# Patient Record
Sex: Male | Born: 1950 | Race: White | Hispanic: No | Marital: Married | Smoking: Former smoker
Health system: Southern US, Community
[De-identification: ages and names within clinical notes are randomized; demographics above are authoritative.]

## PROBLEM LIST (undated history)

## (undated) DIAGNOSIS — R3915 Urgency of urination: Secondary | ICD-10-CM

## (undated) DIAGNOSIS — I10 Essential (primary) hypertension: Secondary | ICD-10-CM

## (undated) DIAGNOSIS — E785 Hyperlipidemia, unspecified: Secondary | ICD-10-CM

## (undated) DIAGNOSIS — H40009 Preglaucoma, unspecified, unspecified eye: Secondary | ICD-10-CM

## (undated) DIAGNOSIS — K219 Gastro-esophageal reflux disease without esophagitis: Secondary | ICD-10-CM

## (undated) DIAGNOSIS — N400 Enlarged prostate without lower urinary tract symptoms: Secondary | ICD-10-CM

## (undated) DIAGNOSIS — Z973 Presence of spectacles and contact lenses: Secondary | ICD-10-CM

## (undated) DIAGNOSIS — Z87442 Personal history of urinary calculi: Secondary | ICD-10-CM

## (undated) DIAGNOSIS — N200 Calculus of kidney: Secondary | ICD-10-CM

## (undated) DIAGNOSIS — J449 Chronic obstructive pulmonary disease, unspecified: Secondary | ICD-10-CM

## (undated) HISTORY — DX: Benign prostatic hyperplasia without lower urinary tract symptoms: N40.0

## (undated) HISTORY — DX: Chronic obstructive pulmonary disease, unspecified: J44.9

## (undated) HISTORY — DX: Hyperlipidemia, unspecified: E78.5

---

## 1978-09-21 HISTORY — PX: OTHER SURGICAL HISTORY: SHX169

## 2015-08-12 ENCOUNTER — Other Ambulatory Visit: Payer: Self-pay | Admitting: Urology

## 2015-08-12 DIAGNOSIS — N200 Calculus of kidney: Secondary | ICD-10-CM

## 2015-09-11 NOTE — Patient Instructions (Addendum)
YOUR PROCEDURE IS SCHEDULED ON : 09/17/15  REPORT TO [facility] MAIN ENTRANCE FOLLOW SIGNS TO  RADIOLOGY CHECK IN AT 7:00 AM  CALL THIS NUMBER IF YOU HAVE PROBLEMS THE MORNING OF SURGERY [phone]  REMEMBER:ONLY 1 PER PERSON MAY GO TO SHORT STAY WITH YOU TO GET READY THE MORNING OF YOUR SURGERY  DO NOT EAT FOOD OR DRINK LIQUIDS AFTER MIDNIGHT  TAKE THESE MEDICINES THE MORNING OF SURGERY:  PRILOSEC  YOU MAY NOT HAVE ANY METAL ON YOUR BODY INCLUDING HAIR PINS AND PIERCING'S. DO NOT WEAR JEWELRY, MAKEUP, LOTIONS, POWDERS OR PERFUMES. DO NOT WEAR NAIL POLISH. DO NOT SHAVE 48 HRS PRIOR TO SURGERY. MEN MAY SHAVE FACE AND NECK.  DO NOT [facility]  IS NOT RESPONSIBLE FOR VALUABLES.  CONTACTS, DENTURES OR PARTIALS MAY NOT BE WORN TO SURGERY. LEAVE SUITCASE IN CAR. CAN BE BROUGHT TO ROOM AFTER SURGERY.  PATIENTS DISCHARGED THE DAY OF SURGERY WILL NOT BE ALLOWED TO DRIVE HOME.  PLEASE READ OVER THE FOLLOWING INSTRUCTION SHEETS _________________________________________________________________________________                                          [facility] - PREPARING FOR SURGERY  Before surgery, you can play an important role.  Because skin is not sterile, your skin needs to be as free of germs as possible.  You can reduce the number of germs on your skin by washing with CHG (chlorahexidine gluconate) soap before surgery.  CHG is an antiseptic cleaner which kills germs and bonds with the skin to continue killing germs even after washing. Please DO NOT use if you have an allergy to CHG or antibacterial soaps.  If your skin becomes reddened/irritated stop using the CHG and inform your nurse when you arrive at Short Stay. Do not shave (including legs and underarms) for at least 48 hours prior to the first CHG shower.  You may shave your face. Please follow these instructions carefully:   1.  Shower with CHG Soap the night before surgery and  the  morning of Surgery.   2.  If you choose to wash your hair, wash your hair first as usual with your  normal  Shampoo.   3.  After you shampoo, rinse your hair and body thoroughly to remove the  shampoo.                                         4.  Use CHG as you would any other liquid soap.  You can apply chg directly  to the skin and wash . Gently wash with scrungie or clean wascloth    5.  Apply the CHG Soap to your body ONLY FROM THE NECK DOWN.   Do not use on open                           Wound or open sores. Avoid contact with eyes, ears mouth and genitals (private parts).                        Genitals (private parts) with your normal soap.              6.  Wash thoroughly, paying special attention to the area where your surgery  will be performed.   7.  Thoroughly rinse your body with warm water from the neck down.   8.  DO NOT shower/wash with your normal soap after using and rinsing off  the CHG Soap .                9.  Pat yourself dry with a clean towel.             10.  Wear clean night clothes to bed after shower             11.  Place clean sheets on your bed the night of your first shower and do not  sleep with pets.  Day of Surgery : Do not apply any lotions/deodorants the morning of surgery.  Please wear clean clothes to the hospital/surgery center.  FAILURE TO FOLLOW THESE INSTRUCTIONS MAY RESULT IN THE CANCELLATION OF YOUR SURGERY    PATIENT SIGNATURE_________________________________  ______________________________________________________________________

## 2015-09-12 ENCOUNTER — Encounter (HOSPITAL_COMMUNITY): Payer: Self-pay

## 2015-09-12 ENCOUNTER — Encounter (HOSPITAL_COMMUNITY)
Admission: RE | Admit: 2015-09-12 | Discharge: 2015-09-12 | Disposition: A | Discharge status: Home / Self Care | Payer: Commercial Managed Care - PPO | Source: Ambulatory Visit | Attending: Urology | Admitting: Urology

## 2015-09-12 DIAGNOSIS — Z01818 Encounter for other preprocedural examination: Secondary | ICD-10-CM | POA: Diagnosis not present

## 2015-09-12 DIAGNOSIS — N200 Calculus of kidney: Secondary | ICD-10-CM | POA: Insufficient documentation

## 2015-09-12 DIAGNOSIS — Z01812 Encounter for preprocedural laboratory examination: Secondary | ICD-10-CM | POA: Insufficient documentation

## 2015-09-12 DIAGNOSIS — R001 Bradycardia, unspecified: Secondary | ICD-10-CM | POA: Insufficient documentation

## 2015-09-12 HISTORY — DX: Gastro-esophageal reflux disease without esophagitis: K21.9

## 2015-09-12 HISTORY — DX: Essential (primary) hypertension: I10

## 2015-09-12 HISTORY — DX: Personal history of urinary calculi: Z87.442

## 2015-09-12 LAB — CBC
HEMATOCRIT: 46.9 % (ref 39.0–52.0)
HEMOGLOBIN: 16.1 g/dL (ref 13.0–17.0)
MCH: 31.4 pg (ref 26.0–34.0)
MCHC: 34.3 g/dL (ref 30.0–36.0)
MCV: 91.6 fL (ref 78.0–100.0)
Platelets: 264 10*3/uL (ref 150–400)
RBC: 5.12 MIL/uL (ref 4.22–5.81)
RDW: 12.1 % (ref 11.5–15.5)
WBC: 5.3 10*3/uL (ref 4.0–10.5)

## 2015-09-12 LAB — PROTIME-INR
INR: 1.16 (ref 0.00–1.49)
Prothrombin Time: 15 seconds (ref 11.6–15.2)

## 2015-09-12 LAB — BASIC METABOLIC PANEL
ANION GAP: 9 (ref 5–15)
BUN: 10 mg/dL (ref 6–20)
CALCIUM: 9.8 mg/dL (ref 8.9–10.3)
CHLORIDE: 103 mmol/L (ref 101–111)
CO2: 30 mmol/L (ref 22–32)
Creatinine, Ser: 1.04 mg/dL (ref 0.61–1.24)
GFR calc Af Amer: >60 mL/min (ref >60)
GFR calc non Af Amer: >60 mL/min (ref >60)
GLUCOSE: 106 mg/dL — AB (ref 65–99)
POTASSIUM: 4.3 mmol/L (ref 3.5–5.1)
Sodium: 142 mmol/L (ref 135–145)

## 2015-09-12 LAB — URINALYSIS, ROUTINE W REFLEX MICROSCOPIC
Bilirubin Urine: NEGATIVE
GLUCOSE, UA: NEGATIVE mg/dL
Hgb urine dipstick: NEGATIVE
Ketones, ur: NEGATIVE mg/dL
LEUKOCYTES UA: NEGATIVE
Nitrite: NEGATIVE
PH: 6.5 (ref 5.0–8.0)
Protein, ur: NEGATIVE mg/dL
SPECIFIC GRAVITY, URINE: 1.012 (ref 1.005–1.030)

## 2015-09-12 LAB — APTT: aPTT: 35 seconds (ref 24–37)

## 2015-09-13 ENCOUNTER — Other Ambulatory Visit: Payer: Self-pay | Admitting: Radiology

## 2015-09-13 LAB — URINE CULTURE: CULTURE: NO GROWTH

## 2015-09-17 ENCOUNTER — Ambulatory Visit (HOSPITAL_COMMUNITY)
Admission: RE | Admit: 2015-09-17 | Discharge: 2015-09-17 | Disposition: A | Discharge status: Home / Self Care | Payer: Commercial Managed Care - PPO | Source: Ambulatory Visit | Attending: Urology | Admitting: Urology

## 2015-09-17 ENCOUNTER — Inpatient Hospital Stay (HOSPITAL_COMMUNITY): Payer: Commercial Managed Care - PPO | Admitting: Anesthesiology

## 2015-09-17 ENCOUNTER — Encounter (HOSPITAL_COMMUNITY): Payer: Self-pay | Admitting: *Deleted

## 2015-09-17 ENCOUNTER — Encounter (HOSPITAL_COMMUNITY): Payer: Self-pay

## 2015-09-17 ENCOUNTER — Encounter (HOSPITAL_COMMUNITY)
Admission: RE | Disposition: A | Discharge status: Home / Self Care | Payer: Self-pay | Source: Ambulatory Visit | Attending: Urology

## 2015-09-17 ENCOUNTER — Inpatient Hospital Stay (HOSPITAL_COMMUNITY): Payer: Commercial Managed Care - PPO

## 2015-09-17 ENCOUNTER — Encounter (HOSPITAL_COMMUNITY): Payer: Self-pay | Admitting: Anesthesiology

## 2015-09-17 ENCOUNTER — Inpatient Hospital Stay (HOSPITAL_COMMUNITY)
Admission: RE | Admit: 2015-09-17 | Discharge: 2015-09-18 | DRG: 661 | Disposition: A | Discharge status: Home / Self Care | Payer: Commercial Managed Care - PPO | Source: Ambulatory Visit | Attending: Urology | Admitting: Urology

## 2015-09-17 DIAGNOSIS — Z01810 Encounter for preprocedural cardiovascular examination: Secondary | ICD-10-CM

## 2015-09-17 DIAGNOSIS — Z87442 Personal history of urinary calculi: Secondary | ICD-10-CM | POA: Diagnosis not present

## 2015-09-17 DIAGNOSIS — Z87891 Personal history of nicotine dependence: Secondary | ICD-10-CM

## 2015-09-17 DIAGNOSIS — I959 Hypotension, unspecified: Secondary | ICD-10-CM | POA: Diagnosis not present

## 2015-09-17 DIAGNOSIS — Z01812 Encounter for preprocedural laboratory examination: Secondary | ICD-10-CM | POA: Diagnosis not present

## 2015-09-17 DIAGNOSIS — N400 Enlarged prostate without lower urinary tract symptoms: Secondary | ICD-10-CM | POA: Diagnosis present

## 2015-09-17 DIAGNOSIS — N200 Calculus of kidney: Secondary | ICD-10-CM

## 2015-09-17 DIAGNOSIS — R3129 Other microscopic hematuria: Secondary | ICD-10-CM | POA: Diagnosis present

## 2015-09-17 DIAGNOSIS — I10 Essential (primary) hypertension: Secondary | ICD-10-CM | POA: Diagnosis present

## 2015-09-17 DIAGNOSIS — K219 Gastro-esophageal reflux disease without esophagitis: Secondary | ICD-10-CM | POA: Diagnosis present

## 2015-09-17 DIAGNOSIS — E7800 Pure hypercholesterolemia, unspecified: Secondary | ICD-10-CM | POA: Diagnosis present

## 2015-09-17 DIAGNOSIS — Z79899 Other long term (current) drug therapy: Secondary | ICD-10-CM | POA: Diagnosis not present

## 2015-09-17 DIAGNOSIS — Z8249 Family history of ischemic heart disease and other diseases of the circulatory system: Secondary | ICD-10-CM | POA: Diagnosis not present

## 2015-09-17 HISTORY — PX: NEPHROLITHOTOMY: SHX5134

## 2015-09-17 HISTORY — DX: Calculus of kidney: N20.0

## 2015-09-17 LAB — CBC WITH DIFFERENTIAL/PLATELET
BASOS ABS: 0 10*3/uL (ref 0.0–0.1)
BASOS PCT: 0 %
Eosinophils Absolute: 0.1 10*3/uL (ref 0.0–0.7)
Eosinophils Relative: 2 %
HEMATOCRIT: 42.6 % (ref 39.0–52.0)
HEMOGLOBIN: 14.5 g/dL (ref 13.0–17.0)
Lymphocytes Relative: 23 %
Lymphs Abs: 1.1 10*3/uL (ref 0.7–4.0)
MCH: 30.8 pg (ref 26.0–34.0)
MCHC: 34 g/dL (ref 30.0–36.0)
MCV: 90.4 fL (ref 78.0–100.0)
Monocytes Absolute: 0.6 10*3/uL (ref 0.1–1.0)
Monocytes Relative: 12 %
NEUTROS ABS: 3.1 10*3/uL (ref 1.7–7.7)
Neutrophils Relative %: 63 %
Platelets: 244 10*3/uL (ref 150–400)
RBC: 4.71 MIL/uL (ref 4.22–5.81)
RDW: 12.2 % (ref 11.5–15.5)
WBC: 4.9 10*3/uL (ref 4.0–10.5)

## 2015-09-17 SURGERY — NEPHROLITHOTOMY PERCUTANEOUS
Anesthesia: General | Laterality: Right

## 2015-09-17 MED ORDER — PANTOPRAZOLE SODIUM 40 MG PO TBEC
40.0000 mg | DELAYED_RELEASE_TABLET | Freq: Every day | ORAL | Status: DC
  Administered 2015-09-17 – 2015-09-18 (×2): 40 mg via ORAL
  Filled 2015-09-17 (×2): qty 1

## 2015-09-17 MED ORDER — LACTATED RINGERS IV SOLN: INTRAVENOUS | Status: DC

## 2015-09-17 MED ORDER — FENTANYL CITRATE (PF) 100 MCG/2ML IJ SOLN
INTRAMUSCULAR | Status: AC | PRN
  Administered 2015-09-17: 50 ug via INTRAVENOUS

## 2015-09-17 MED ORDER — HYDROMORPHONE HCL 1 MG/ML IJ SOLN
INTRAMUSCULAR | Status: DC | PRN
  Administered 2015-09-17: 1 mg via INTRAVENOUS
  Administered 2015-09-17 (×2): 0.5 mg via INTRAVENOUS

## 2015-09-17 MED ORDER — HYDROMORPHONE HCL 2 MG/ML IJ SOLN
INTRAMUSCULAR | Status: AC
  Filled 2015-09-17: qty 1

## 2015-09-17 MED ORDER — SODIUM CHLORIDE 0.9 % IV SOLN
INTRAVENOUS | Status: DC
  Administered 2015-09-17: 1000 mL via INTRAVENOUS
  Administered 2015-09-18: 03:00:00 via INTRAVENOUS

## 2015-09-17 MED ORDER — MORPHINE SULFATE (PF) 2 MG/ML IV SOLN
2.0000 mg | INTRAVENOUS | Status: DC | PRN
  Filled 2015-09-17: qty 1

## 2015-09-17 MED ORDER — SODIUM CHLORIDE 0.9 % IR SOLN
Status: DC | PRN
  Administered 2015-09-17: 51000 mL

## 2015-09-17 MED ORDER — LACTATED RINGERS IV SOLN
INTRAVENOUS | Status: DC | PRN
  Administered 2015-09-17 (×2): via INTRAVENOUS

## 2015-09-17 MED ORDER — SODIUM CHLORIDE 0.9 % IJ SOLN
INTRAMUSCULAR | Status: AC
  Filled 2015-09-17: qty 10

## 2015-09-17 MED ORDER — LISINOPRIL 40 MG PO TABS
40.0000 mg | ORAL_TABLET | Freq: Every day | ORAL | Status: DC
  Administered 2015-09-17: 40 mg via ORAL
  Filled 2015-09-17 (×3): qty 1

## 2015-09-17 MED ORDER — SODIUM CHLORIDE 0.9 % IV SOLN
INTRAVENOUS | Status: DC
Start: 2015-09-17 — End: 2015-09-17
  Administered 2015-09-17: 09:00:00 via INTRAVENOUS

## 2015-09-17 MED ORDER — MIDAZOLAM HCL 2 MG/2ML IJ SOLN
INTRAMUSCULAR | Status: AC | PRN
  Administered 2015-09-17: 1 mg via INTRAVENOUS
  Administered 2015-09-17 (×2): 0.5 mg via INTRAVENOUS

## 2015-09-17 MED ORDER — CEFAZOLIN SODIUM-DEXTROSE 2-3 GM-% IV SOLR
INTRAVENOUS | Status: AC
  Filled 2015-09-17: qty 50

## 2015-09-17 MED ORDER — ROCURONIUM BROMIDE 100 MG/10ML IV SOLN
INTRAVENOUS | Status: DC | PRN
  Administered 2015-09-17: 40 mg via INTRAVENOUS
  Administered 2015-09-17 (×3): 10 mg via INTRAVENOUS

## 2015-09-17 MED ORDER — HYDROMORPHONE HCL 1 MG/ML IJ SOLN: 0.2500 mg | INTRAMUSCULAR | Status: DC | PRN

## 2015-09-17 MED ORDER — DOCUSATE SODIUM 100 MG PO CAPS
100.0000 mg | ORAL_CAPSULE | Freq: Two times a day (BID) | ORAL | Status: DC
  Administered 2015-09-17 – 2015-09-18 (×2): 100 mg via ORAL
  Filled 2015-09-17 (×2): qty 1

## 2015-09-17 MED ORDER — CEFAZOLIN SODIUM-DEXTROSE 2-3 GM-% IV SOLR
INTRAVENOUS | Status: DC | PRN
  Administered 2015-09-17: 2 g via INTRAVENOUS

## 2015-09-17 MED ORDER — PROPOFOL 10 MG/ML IV BOLUS
INTRAVENOUS | Status: AC
  Filled 2015-09-17: qty 20

## 2015-09-17 MED ORDER — PROPOFOL 10 MG/ML IV BOLUS
INTRAVENOUS | Status: DC | PRN
  Administered 2015-09-17: 150 mg via INTRAVENOUS

## 2015-09-17 MED ORDER — CEFAZOLIN SODIUM-DEXTROSE 2-3 GM-% IV SOLR
2.0000 g | Freq: Three times a day (TID) | INTRAVENOUS | Status: AC
  Administered 2015-09-17 – 2015-09-18 (×2): 2 g via INTRAVENOUS
  Filled 2015-09-17 (×2): qty 50

## 2015-09-17 MED ORDER — EPHEDRINE SULFATE 50 MG/ML IJ SOLN
INTRAMUSCULAR | Status: DC | PRN
  Administered 2015-09-17: 10 mg via INTRAVENOUS
  Administered 2015-09-17: 5 mg via INTRAVENOUS
  Administered 2015-09-17 (×2): 10 mg via INTRAVENOUS

## 2015-09-17 MED ORDER — SUGAMMADEX SODIUM 200 MG/2ML IV SOLN
INTRAVENOUS | Status: AC
  Filled 2015-09-17: qty 2

## 2015-09-17 MED ORDER — IOHEXOL 300 MG/ML  SOLN
15.0000 mL | Freq: Once | INTRAMUSCULAR | Status: AC | PRN
  Administered 2015-09-17: 15 mL

## 2015-09-17 MED ORDER — MEPERIDINE HCL 50 MG/ML IJ SOLN: 6.2500 mg | INTRAMUSCULAR | Status: DC | PRN

## 2015-09-17 MED ORDER — OMEPRAZOLE MAGNESIUM 20 MG PO TBEC: 20.0000 mg | DELAYED_RELEASE_TABLET | Freq: Every day | ORAL | Status: DC

## 2015-09-17 MED ORDER — MIDAZOLAM HCL 2 MG/2ML IJ SOLN
INTRAMUSCULAR | Status: AC
Start: 2015-09-17 — End: 2015-09-17
  Filled 2015-09-17: qty 6

## 2015-09-17 MED ORDER — PROMETHAZINE HCL 25 MG/ML IJ SOLN
INTRAMUSCULAR | Status: AC
  Filled 2015-09-17: qty 1

## 2015-09-17 MED ORDER — SUGAMMADEX SODIUM 200 MG/2ML IV SOLN
INTRAVENOUS | Status: DC | PRN
  Administered 2015-09-17: 150 mg via INTRAVENOUS

## 2015-09-17 MED ORDER — FENTANYL CITRATE (PF) 100 MCG/2ML IJ SOLN
INTRAMUSCULAR | Status: AC
  Filled 2015-09-17: qty 4

## 2015-09-17 MED ORDER — HYDROCODONE-ACETAMINOPHEN 5-325 MG PO TABS
1.0000 | ORAL_TABLET | ORAL | Status: DC | PRN
  Administered 2015-09-17 – 2015-09-18 (×3): 2 via ORAL
  Filled 2015-09-17 (×3): qty 2

## 2015-09-17 MED ORDER — LIDOCAINE HCL (CARDIAC) 20 MG/ML IV SOLN
INTRAVENOUS | Status: DC | PRN
  Administered 2015-09-17: 50 mg via INTRAVENOUS

## 2015-09-17 MED ORDER — IOHEXOL 300 MG/ML  SOLN
INTRAMUSCULAR | Status: DC | PRN
  Administered 2015-09-17: 50 mL

## 2015-09-17 MED ORDER — CEFAZOLIN SODIUM-DEXTROSE 2-3 GM-% IV SOLR
INTRAVENOUS | Status: AC
Start: 2015-09-17 — End: 2015-09-17
  Filled 2015-09-17: qty 50

## 2015-09-17 MED ORDER — ONDANSETRON HCL 4 MG/2ML IJ SOLN
4.0000 mg | INTRAMUSCULAR | Status: DC | PRN
  Administered 2015-09-17: 4 mg via INTRAVENOUS
  Filled 2015-09-17: qty 2

## 2015-09-17 MED ORDER — HEPARIN SODIUM (PORCINE) 5000 UNIT/ML IJ SOLN
5000.0000 [IU] | Freq: Three times a day (TID) | INTRAMUSCULAR | Status: DC
Start: 2015-09-17 — End: 2015-09-18
  Administered 2015-09-17 – 2015-09-18 (×2): 5000 [IU] via SUBCUTANEOUS
  Filled 2015-09-17 (×3): qty 1

## 2015-09-17 MED ORDER — FENTANYL CITRATE (PF) 100 MCG/2ML IJ SOLN
INTRAMUSCULAR | Status: DC | PRN
  Administered 2015-09-17: 50 ug via INTRAVENOUS
  Administered 2015-09-17: 100 ug via INTRAVENOUS
  Administered 2015-09-17: 50 ug via INTRAVENOUS

## 2015-09-17 MED ORDER — FENTANYL CITRATE (PF) 250 MCG/5ML IJ SOLN
INTRAMUSCULAR | Status: AC
  Filled 2015-09-17: qty 5

## 2015-09-17 MED ORDER — CEFAZOLIN SODIUM-DEXTROSE 2-3 GM-% IV SOLR
2.0000 g | INTRAVENOUS | Status: DC
Start: 2015-09-17 — End: 2015-09-17

## 2015-09-17 MED ORDER — PROMETHAZINE HCL 25 MG/ML IJ SOLN
6.2500 mg | INTRAMUSCULAR | Status: AC | PRN
  Administered 2015-09-17 (×2): 6.25 mg via INTRAVENOUS

## 2015-09-17 MED ORDER — CEFAZOLIN SODIUM-DEXTROSE 2-3 GM-% IV SOLR
2.0000 g | INTRAVENOUS | Status: AC
  Administered 2015-09-17: 2 g via INTRAVENOUS

## 2015-09-17 MED ORDER — LIDOCAINE HCL 1 % IJ SOLN
INTRAMUSCULAR | Status: AC
  Filled 2015-09-17: qty 20

## 2015-09-17 SURGICAL SUPPLY — 61 items
BAG URINE DRAINAGE (UROLOGICAL SUPPLIES) ×3 IMPLANT
BAG URO CATCHER STRL LF (MISCELLANEOUS) IMPLANT
BASKET LASER NITINOL 1.9FR (BASKET) IMPLANT
BASKET ZERO TIP NITINOL 2.4FR (BASKET) ×3 IMPLANT
BENZOIN TINCTURE PRP APPL 2/3 (GAUZE/BANDAGES/DRESSINGS) ×3 IMPLANT
BLADE SURG 15 STRL LF DISP TIS (BLADE) ×1 IMPLANT
BLADE SURG 15 STRL SS (BLADE) ×2
CATH FOLEY 2W COUNCIL 20FR 5CC (CATHETERS) IMPLANT
CATH FOLEY 2W COUNCIL 5CC 18FR (CATHETERS) ×3 IMPLANT
CATH FOLEY 2WAY SLVR  5CC 16FR (CATHETERS) ×2
CATH FOLEY 2WAY SLVR 5CC 16FR (CATHETERS) ×1 IMPLANT
CATH IMAGER II 65CM (CATHETERS) IMPLANT
CATH INTERMIT  6FR 70CM (CATHETERS) IMPLANT
CATH ROBINSON RED A/P 20FR (CATHETERS) IMPLANT
CATH X-FORCE N30 NEPHROSTOMY (TUBING) ×3 IMPLANT
CHLORAPREP W/TINT 26ML (MISCELLANEOUS) ×3 IMPLANT
COVER SURGICAL LIGHT HANDLE (MISCELLANEOUS) IMPLANT
DRAPE C-ARM 42X120 X-RAY (DRAPES) ×3 IMPLANT
DRAPE LINGEMAN PERC (DRAPES) ×3 IMPLANT
DRAPE SHEET LG 3/4 BI-LAMINATE (DRAPES) ×3 IMPLANT
DRAPE SURG IRRIG POUCH 19X23 (DRAPES) ×3 IMPLANT
DRSG PAD ABDOMINAL 8X10 ST (GAUZE/BANDAGES/DRESSINGS) ×6 IMPLANT
DRSG TEGADERM 8X12 (GAUZE/BANDAGES/DRESSINGS) ×3 IMPLANT
FIBER LASER FLEXIVA 1000 (UROLOGICAL SUPPLIES) IMPLANT
FIBER LASER FLEXIVA 200 (UROLOGICAL SUPPLIES) IMPLANT
FIBER LASER FLEXIVA 365 (UROLOGICAL SUPPLIES) IMPLANT
FIBER LASER FLEXIVA 550 (UROLOGICAL SUPPLIES) IMPLANT
FIBER LASER TRAC TIP (UROLOGICAL SUPPLIES) IMPLANT
GAUZE SPONGE 4X4 12PLY STRL (GAUZE/BANDAGES/DRESSINGS) ×3 IMPLANT
GLOVE BIOGEL M STRL SZ7.5 (GLOVE) ×9 IMPLANT
GOWN STRL REUS W/TWL LRG LVL3 (GOWN DISPOSABLE) ×6 IMPLANT
GUIDEWIRE AMPLAZ .035X145 (WIRE) ×6 IMPLANT
GUIDEWIRE ANG ZIPWIRE 038X150 (WIRE) IMPLANT
GUIDEWIRE STR DUAL SENSOR (WIRE) ×3 IMPLANT
IV SET EXTENSION CATH 6 NF (IV SETS) IMPLANT
KIT BASIN OR (CUSTOM PROCEDURE TRAY) ×3 IMPLANT
MANIFOLD NEPTUNE II (INSTRUMENTS) ×3 IMPLANT
NEEDLE TROCAR 18X15 ECHO (NEEDLE) IMPLANT
NEEDLE TROCAR 18X20 (NEEDLE) IMPLANT
NS IRRIG 1000ML POUR BTL (IV SOLUTION) IMPLANT
PACK CYSTO (CUSTOM PROCEDURE TRAY) ×3 IMPLANT
PAD ABD 8X10 STRL (GAUZE/BANDAGES/DRESSINGS) ×3 IMPLANT
PROBE LITHOCLAST ULTRA 3.8X403 (UROLOGICAL SUPPLIES) ×3 IMPLANT
PROBE PNEUMATIC 1.0MMX570MM (UROLOGICAL SUPPLIES) ×3 IMPLANT
SET IRRIG Y TYPE TUR BLADDER L (SET/KITS/TRAYS/PACK) IMPLANT
SET WARMING FLUID IRRIGATION (MISCELLANEOUS) IMPLANT
SHEATH PEELAWAY SET 9 (SHEATH) ×3 IMPLANT
SPONGE LAP 4X18 X RAY DECT (DISPOSABLE) ×3 IMPLANT
STENT CONTOUR 6FRX26X.038 (STENTS) ×3 IMPLANT
STONE CATCHER W/TUBE ADAPTER (UROLOGICAL SUPPLIES) ×3 IMPLANT
SUT SILK 2 0 30  PSL (SUTURE) ×2
SUT SILK 2 0 30 PSL (SUTURE) ×1 IMPLANT
SYR 20CC LL (SYRINGE) ×6 IMPLANT
SYR 50ML LL SCALE MARK (SYRINGE) ×3 IMPLANT
SYRINGE 10CC LL (SYRINGE) ×3 IMPLANT
TOWEL OR 17X26 10 PK STRL BLUE (TOWEL DISPOSABLE) ×3 IMPLANT
TUBE FEEDING 8FR 16IN STR KANG (MISCELLANEOUS) IMPLANT
TUBING CONNECTING 10 (TUBING) ×4 IMPLANT
TUBING CONNECTING 10' (TUBING) ×2
WATER STERILE IRR 1500ML POUR (IV SOLUTION) IMPLANT
WATER STERILE IRR 3000ML UROMA (IV SOLUTION) IMPLANT

## 2015-09-17 NOTE — Anesthesia Postprocedure Evaluation (Signed)
Anesthesia Post Note  Patient: Joel Jenkins  Procedure(s) Performed: Procedure(s) (LRB): RIGHT  PERCUTANEOUS NEPHROLITHOTOMY  (Right)  Patient location during evaluation: PACU Anesthesia Type: General Level of consciousness: awake and alert Pain management: pain level controlled Vital Signs Assessment: post-procedure vital signs reviewed and stable Respiratory status: spontaneous breathing, nonlabored ventilation, respiratory function stable and patient connected to nasal cannula oxygen Cardiovascular status: blood pressure returned to baseline and stable Postop Assessment: no signs of nausea or vomiting Anesthetic complications: no    Last Vitals:  Filed Vitals:   09/17/15 1600 09/17/15 1615  BP: 105/67 109/69  Pulse: 72 73  Temp:  36.7 C  Resp: 9 12    Last Pain:  Filed Vitals:   09/17/15 1621  PainSc: Asleep                 Effie Berkshire

## 2015-09-17 NOTE — Anesthesia Preprocedure Evaluation (Addendum)
Anesthesia Evaluation  Patient identified by MRN, date of birth, ID band Patient awake    Reviewed: Allergy & Precautions, NPO status , Patient's Chart, lab work & pertinent test results  Airway Mallampati: II  TM Distance: >3 FB Neck ROM: Full    Dental  (+) Teeth Intact   Pulmonary former smoker,    breath sounds clear to auscultation       Cardiovascular hypertension, Pt. on medications  Rhythm:Regular Rate:Normal     Neuro/Psych negative neurological ROS  negative psych ROS   GI/Hepatic Neg liver ROS, GERD  Medicated,  Endo/Other  negative endocrine ROS  Renal/GU negative Renal ROS  negative genitourinary   Musculoskeletal negative musculoskeletal ROS (+)   Abdominal   Peds negative pediatric ROS (+)  Hematology negative hematology ROS (+)   Anesthesia Other Findings   Reproductive/Obstetrics negative OB ROS                           Lab Results  Component Value Date   WBC 4.9 09/17/2015   HGB 14.5 09/17/2015   HCT 42.6 09/17/2015   MCV 90.4 09/17/2015   PLT 244 09/17/2015   Lab Results  Component Value Date   CREATININE 1.04 09/12/2015   BUN 10 09/12/2015   NA 142 09/12/2015   K 4.3 09/12/2015   CL 103 09/12/2015   CO2 30 09/12/2015   Lab Results  Component Value Date   INR 1.16 09/12/2015   08/2015: EKG: sinus bradycardia, PAC's.   Anesthesia Physical Anesthesia Plan  ASA: III  Anesthesia Plan: General   Post-op Pain Management:    Induction: Intravenous  Airway Management Planned: Oral ETT  Additional Equipment:   Intra-op Plan:   Post-operative Plan: Extubation in OR  Informed Consent: I have reviewed the patients History and Physical, chart, labs and discussed the procedure including the risks, benefits and alternatives for the proposed anesthesia with the patient or authorized representative who has indicated his/her understanding and acceptance.    Dental advisory given  Plan Discussed with: CRNA  Anesthesia Plan Comments: (Possible LMA.)       Anesthesia Quick Evaluation

## 2015-09-17 NOTE — Op Note (Signed)
Date of procedure: 09/17/2015  Preoperative diagnosis:  1. Right staghorn calculus  Postoperative diagnosis:  1. Right staghorn calculus   Procedure: 1. Right percutaneous nephrolithotomy 2. Antegrade nephrostogram with interpretation 3. Right antegrade ureteral stent placement (6 [place] by 26 cm) 4. Stone basketing  Surgeon: Baruch Gouty, MD  Anesthesia: General  Complications: None  Intraoperative findings: Right staghorn calculus  EBL: Minimal  Specimens: Right staghorn calculus pieces to office lab  Drains: 6 French by 26 cm double-J right ureteral stent, 16 French Foley catheter, 18 French percutaneous nephrostomy tube on the right  Disposition: Stable to the postanesthesia care unit  Indication for procedure: The patient is a 64 y.o. male with right staghorn calculus presenting for definitive surgical management.  After reviewing the management options for treatment, the patient elected to proceed with the above surgical procedure(s). We have discussed the potential benefits and risks of the procedure, side effects of the proposed treatment, the likelihood of the patient achieving the goals of the procedure, and any potential problems that might occur during the procedure or recuperation. Informed consent has been obtained.  Description of procedure: The patient was met in the preoperative area. All risks, benefits, and indications of the procedure were described in great detail. The patient consented to the procedure. Preoperative antibiotics were given. The patient was taken to the operative theater. General anesthesia was induced per the anesthesia service. A Foley catheter was placed per urethra. The patient was then placed in the prone position with all pressure points appropriately padded. The patient was then prepped and draped in usual sterile fashion. A timeout was called. Through the previously placed right nephroureteral catheter placement interventional radiology a  Super Stiff guidewire was placed. Super Stiff guidewires placed on the level of the bladder antegrade. The nephroureteral catheter was removed. An axial dilator was then placed over the Super Stiff guidewire under fluoroscopy. A second superstiff guidewire was then placed through this catheter. The exit dilator was then removed. Both wires were confirmed to be in the urinary bladder fluoroscopy. The balloon dilator was placed over one of the guidewires under fluoroscopy and inflated 20 mmHg. It was held here for 5 minutes. The access sheath was then placed over the balloon under fluoroscopy. The balloon was then deflated. The left was assembled inserted into the access sheath. Immediately upon entering the lower pole of the right kidney the right staghorn calculus was noted. The lithotripter was then used to break this into small fragments. These fractures then removed individually. This took place for approximately 2 hours. Given the procedure all stone removed from the superior and inferior poles as well as the renal pelvis.there were some residual stone fragment in the right middle pole that was unable to be accessed from her lower pole access. There are also some tiny fragments near the UPJ. Otherwise the remaining stone burden had been removed. There is also some fragments noted to be within the parenchyma of the kidney and the retroperitoneum that showed up on fluoroscopy but were not actually in the collecting system. At this point, all stone fragment so easily accessible have been removed. Over the guidewire that was accessible from within the access sheath a 6 [place] by 2670 double-J right ureteral stent was placed in antegrade fashion. A guidewire was removed. Correct placement of the right ureter stent was confirmed to curl seen in the patient's urinary bladder fluoroscopy and in the renal pelvis with direct visualization and fluoroscopy. The access sheath was then removed. Over the  remaining wire, an 75  [place] council catheter was placed the renal pelvis. A antegrade powder was performed to confirm correct placement. 3 cc of water was placed into the balloon. This again confirmed to be in the pelvis and fluoroscopy. The drainage then used to fix the nephrostomy tube to the skin. At this point she was considered over. The patient was woke from anesthesia and transferred stable condition to the post.  Plan: The patient will be admitted to the floor overnight. He'll likely be discharged home with his nephrostomy tube in place. We'll plan to remove it later this week in the office. He will need cystoscopy with right ureteroscopy to clear residual stone fragments in approximately 4 weeks.    Baruch Gouty, M.D.

## 2015-09-17 NOTE — H&P (Signed)
Chief Complaint    Right Renal Stone    Referring: Vassie Loll Eyk   History of Present Illness    The patient is a 64 year old gentleman with a history of nephrolithiasis who presents today for nephrolithiasis. The stone was seen on a x-ray obtained by his orthopedist. The films are not available for review so it's unclear how largest stone is. However, his orthopedist and his primary care doctor referred to it as a staghorn calculus. He has had multiple stones in the past. In his 69s he had an open ureterolithotomy. He also has passed stones in the past.    I discussed the risks, benefits, and indications of PSA screening with the patient. At this time he does not want to receive PSA testing.    Interval history:  The patient returns today to discuss a right PCNL. He is doing well since last visit. He denies any flank pain or recurrent urinary tract infections.      Past Medical History Problems  1. History of heartburn (Z87.898) 2. History of hypercholesterolemia (Z86.39) 3. History of hypertension (Z86.79)  Surgical History Problems  1. History of Cystoscopy With Ureteroscopy With Manipulation Of Calculus 2. History of Kidney Surgery  Current Meds 1. Lisinopril 40 MG Oral Tablet;  Therapy: (Recorded:18Oct2016) to Recorded 2. PriLOSEC 20 MG Oral Capsule Delayed Release;  Therapy: (Recorded:18Oct2016) to Recorded  Allergies Medication  1. No Known Drug Allergies  Family History Problems  1. Family history of heart failure (Z82.49) : Mother 2. Family history of kidney stones (Z84.1) : Sister 3. Family history of pneumonia (Z83.1) : Father  Social History Problems  1. Denied: History of Alcohol use 2. Caffeine use (F15.90)   3 3. Former smoker [id number])   2 pks for 20 yrs / quit 10 yrs ago 4. Married 5. Number of children   2 daughters 6. Occupation   truck Research officer, political party Vital Signs [Data Includes: Last 1 Day]  Recorded: 21Nov2016 09:02AM   Blood Pressure: 174 / 97 Temperature: 97.1 F Heart Rate: 60  Physical Exam Constitutional: Well nourished . No acute distress.  ENT:. The ears and nose are normal in appearance.  Neck: The appearance of the neck is normal.  Pulmonary: No respiratory distress.  Cardiovascular:. No peripheral edema.  Abdomen: The abdomen is not distended. The abdomen is soft and nontender. No CVA tenderness.  Skin: Normal skin turgor and no visible rash.  Neuro/Psych:. Mood and affect are appropriate.    Results/Data Urine [Data Includes: Last 1 Day]   [id number]  COLOR YELLOW   APPEARANCE CLEAR   SPECIFIC GRAVITY 1.020   pH 6.0   GLUCOSE NEGATIVE   BILIRUBIN NEGATIVE   KETONE NEGATIVE   BLOOD TRACE   PROTEIN NEGATIVE   NITRITE NEGATIVE   LEUKOCYTE ESTERASE NEGATIVE   SQUAMOUS EPITHELIAL/HPF NONE SEEN HPF  WBC NONE SEEN WBC/HPF  RBC 3-10 RBC/HPF  BACTERIA NONE SEEN HPF  CRYSTALS NONE SEEN HPF  CASTS NONE SEEN LPF  Yeast NONE SEEN HPF   Procedure CT: Right staghorn calculus     Assessment Assessed  1. Nephrolithiasis (N20.0)  Plan Health Maintenance  1. UA With REFLEX; [Do Not Release]; Status:Complete;   DoneFL:[id number] 08:43AM Nephrolithiasis  2. Follow-up Office  Follow-up for surgery.  Selita will call patient  Status: Hold For - Date of  Service  Requested for: 21Nov2016     1 Nephrolithiasis  I discussed the patient's treatment options for a staghorn calculus. We went over in great  detail the risks, benefits, indications of a right percutaneous nephrolithotomy. He is aware the risks include bleeding, infection, and multiple operations to achieve stone free state. He was informed that he will likely given the size of the stone require multiple operations to render him stone free. He does also wear that he will be in the hospital at least one night postoperatively. He will have a Foley catheter, right ureteral stent, and right percutaneous nephrostomy tube postoperatively. His  aware that he will likely go home without the Foley percutaneous nephrostomy tube, but he is guaranteed to go home with stent. He is agreeable to proceeding and we will schedule him in the near future.     2. Microhematuria  -We will treat his stone disease and recheck a urinalysis. If he continues to have microhematuria at that time, we will do a formal hematuria workup.   Discussion/Summary CC: Vassie Loll Eyk     Signatures Electronically signed by : Baruch Gouty, M.D.; Aug 12 2015  9:20AM EST

## 2015-09-17 NOTE — Procedures (Signed)
Interventional Radiology Procedure Note  Procedure:  Right nephroureteral catheter placement  Complications:  None  Estimated Blood Loss: < 10 mL  Via LP access, 5 Fr catheter advanced around large right staghorn renal calculus into bladder.  For operative nephrolithotomy to follow with Dr. Pilar Jarvis.   Venetia Night. Kathlene Cote, M.D Pager:  [phone]

## 2015-09-17 NOTE — Anesthesia Procedure Notes (Signed)
Procedure Name: Intubation Date/Time: 09/17/2015 12:00 PM Performed by: Anne Fu Pre-anesthesia Checklist: Patient identified, Emergency Drugs available, Suction available, Patient being monitored and Timeout performed Patient Re-evaluated:Patient Re-evaluated prior to inductionOxygen Delivery Method: Circle system utilized Preoxygenation: Pre-oxygenation with 100% oxygen Intubation Type: IV induction Ventilation: Mask ventilation without difficulty Laryngoscope Size: Mac and 4 Grade View: Grade I Tube type: Oral Tube size: 7.5 mm Number of attempts: 1 Airway Equipment and Method: Stylet Placement Confirmation: ETT inserted through vocal cords under direct vision,  positive ETCO2,  CO2 detector and breath sounds checked- equal and bilateral Secured at: 20 cm Tube secured with: Tape Dental Injury: Teeth and Oropharynx as per pre-operative assessment

## 2015-09-17 NOTE — H&P (Signed)
Chief Complaint: Patient was seen in consultation today for right renal calculus at the request of Nickie Retort  Referring Physician(s): Nickie Retort  History of Present Illness: Joel Jenkins is a 64 y.o. male found to have a right renal staghorn calculus when getting a xray of his back. He denies any flank pain, gross hematuria, fever or chills. He has been seen by Urology, Dr. Pilar Jarvis and scheduled today for image guided right percutaneous nephrostomy tube placement with PCNL to follow in OR. He denies any chest pain, shortness of breath or palpitations. He denies any active signs of bleeding or excessive bruising. The patient denies any history of sleep apnea or chronic oxygen use. He has no known complications to sedation.    Past Medical History  Diagnosis Date  . Hypertension   . Shortness of breath dyspnea     WITH EXERTION  . GERD (gastroesophageal reflux disease)   . History of kidney stones   . Enlarged prostate     Past Surgical History  Procedure Laterality Date  . Cystoscopy  1980    TO REMOVE KIDNEY STONE    Allergies: Review of patient's allergies indicates no known allergies.  Medications: Prior to Admission medications   Medication Sig Start Date End Date Taking? Authorizing Provider  lisinopril (PRINIVIL,ZESTRIL) 40 MG tablet Take 40 mg by mouth daily.    Historical Provider, MD  Multiple Vitamins-Minerals (ICAPS AREDS 2) CAPS Take 1 capsule by mouth 2 (two) times daily.    Historical Provider, MD  Omega-3 Fatty Acids (FISH OIL PO) Take 1 capsule by mouth 2 (two) times daily.    Historical Provider, MD  omeprazole (PRILOSEC OTC) 20 MG tablet Take 20 mg by mouth daily.    Historical Provider, MD     History reviewed. No pertinent family history.  Social History   Social History  . Marital Status: Married    Spouse Name: N/A  . Number of Children: N/A  . Years of Education: N/A   Social History Main Topics  . Smoking status: Former  Smoker    Quit date: 09/12/2011  . Smokeless tobacco: None  . Alcohol Use: No  . Drug Use: No  . Sexual Activity: Not Asked   Other Topics Concern  . None   Social History Narrative    Review of Systems: A 12 point ROS discussed and pertinent positives are indicated in the HPI above.  All other systems are negative.  Review of Systems  Vital Signs: T: 97.8 F, HR: 69 bpm, BP: 121/78 mmHg, O2: 96% RA  Physical Exam  Constitutional: He is oriented to person, place, and time. No distress.  HENT:  Head: Normocephalic and atraumatic.  Neck: No tracheal deviation present.  Cardiovascular: Normal rate and regular rhythm.  Exam reveals no gallop and no friction rub.   No murmur heard. Pulmonary/Chest: Effort normal and breath sounds normal. No respiratory distress. He has no wheezes. He has no rales.  Abdominal: Soft. Bowel sounds are normal. He exhibits no distension. There is no tenderness.  Neurological: He is alert and oriented to person, place, and time.  Skin: Skin is warm and dry. He is not diaphoretic.    Mallampati Score:  MD Evaluation Airway: WNL Heart: WNL Abdomen: WNL Chest/ Lungs: WNL ASA  Classification: 3 Mallampati/Airway Score: Two  Imaging: No results found.  Labs:  CBC:  Recent Labs  09/12/15 1055 09/17/15 0830  WBC 5.3 4.9  HGB 16.1 14.5  HCT 46.9 42.6  PLT 264 244    COAGS:  Recent Labs  09/12/15 1055  INR 1.16  APTT 35    BMP:  Recent Labs  09/12/15 1055  NA 142  K 4.3  CL 103  CO2 30  GLUCOSE 106*  BUN 10  CALCIUM 9.8  CREATININE 1.04  GFRNONAA >60  GFRAA >60    Assessment and Plan: Right renal staghorn calculus Seen by Urology, Dr. Pilar Jarvis Scheduled today for image guided right percutaneous nephrostomy tube placement with PCNL to follow in OR The patient has been NPO, no blood thinners taken, labs and vitals have been reviewed. Risks and Benefits discussed with the patient including, but not limited to  infection, bleeding, significant bleeding causing loss or decrease in renal function or damage to adjacent structures.  All of the patient's questions were answered, patient is agreeable to proceed. Consent signed and in chart.    Thank you for this interesting consult.  I greatly enjoyed meeting Joel Jenkins and look forward to participating in their care.  A copy of this report was sent to the requesting provider on this date.  SignedHedy Jacob 09/17/2015, 9:04 AM   I spent a total of 15 Minutes in face to face in clinical consultation, greater than 50% of which was counseling/coordinating care for right renal calculus

## 2015-09-17 NOTE — Transfer of Care (Signed)
Immediate Anesthesia Transfer of Care Note  Patient: Joel Jenkins  Procedure(s) Performed: Procedure(s): RIGHT  PERCUTANEOUS NEPHROLITHOTOMY  (Right) HOLMIUM LASER APPLICATION (Right)  Patient Location: PACU  Anesthesia Type:General  Level of Consciousness:  sedated, patient cooperative and responds to stimulation  Airway & Oxygen Therapy:Patient Spontanous Breathing and Patient connected to face mask oxgen  Post-op Assessment:  Report given to PACU RN and Post -op Vital signs reviewed and stable  Post vital signs:  Reviewed and stable  Last Vitals:  Filed Vitals:   09/17/15 0736  BP: 121/78  Pulse: 69  Temp: 36.6 C  Resp: 18    Complications: No apparent anesthesia complications

## 2015-09-18 ENCOUNTER — Encounter (HOSPITAL_COMMUNITY): Payer: Self-pay | Admitting: Urology

## 2015-09-18 LAB — BASIC METABOLIC PANEL
Anion gap: 7 (ref 5–15)
BUN: 11 mg/dL (ref 6–20)
CO2: 27 mmol/L (ref 22–32)
CREATININE: 1.26 mg/dL — AB (ref 0.61–1.24)
Calcium: 8 mg/dL — ABNORMAL LOW (ref 8.9–10.3)
Chloride: 106 mmol/L (ref 101–111)
GFR calc Af Amer: >60 mL/min (ref >60)
GFR, EST NON AFRICAN AMERICAN: 59 mL/min — AB (ref >60)
GLUCOSE: 106 mg/dL — AB (ref 65–99)
Potassium: 4.1 mmol/L (ref 3.5–5.1)
SODIUM: 140 mmol/L (ref 135–145)

## 2015-09-18 LAB — CBC
HCT: 33.7 % — ABNORMAL LOW (ref 39.0–52.0)
Hemoglobin: 11.1 g/dL — ABNORMAL LOW (ref 13.0–17.0)
MCH: 31 pg (ref 26.0–34.0)
MCHC: 32.9 g/dL (ref 30.0–36.0)
MCV: 94.1 fL (ref 78.0–100.0)
PLATELETS: 191 10*3/uL (ref 150–400)
RBC: 3.58 MIL/uL — ABNORMAL LOW (ref 4.22–5.81)
RDW: 12.5 % (ref 11.5–15.5)
WBC: 8.8 10*3/uL (ref 4.0–10.5)

## 2015-09-18 MED ORDER — CEPHALEXIN 500 MG PO CAPS: 500.0000 mg | ORAL_CAPSULE | Freq: Four times a day (QID) | ORAL | Status: DC

## 2015-09-18 MED ORDER — DOCUSATE SODIUM 100 MG PO CAPS: 100.0000 mg | ORAL_CAPSULE | Freq: Two times a day (BID) | ORAL | Status: DC

## 2015-09-18 MED ORDER — HYDROCODONE-ACETAMINOPHEN 5-325 MG PO TABS: 1.0000 | ORAL_TABLET | ORAL | Status: DC | PRN

## 2015-09-18 NOTE — Progress Notes (Signed)
BP 80/51. Patient asymptomatic. Dr. Frederico Hamman made aware. No new orders placed. Will continue to monitor closely

## 2015-09-18 NOTE — Progress Notes (Signed)
Slightly hypotensive overnight - improved. Hg 11.1. Good uop Mild N Pain controlled No flatus/bm  Filed Vitals:   09/17/15 2037 09/18/15 0237 09/18/15 0420 09/18/15 0640  BP: 107/66 80/51 88/51  100/55  Pulse: 67 60  80  Temp: 97.4 F (36.3 C) 98.4 F (36.9 C)  98.3 F (36.8 C)  TempSrc: Oral Oral  Oral  Resp: 14 14  12   Height:      Weight:      SpO2: 100% 99%  96%   I/O last 3 completed shifts: In: 2395 [I.V.:2000; Other:395] Out: [id number] [Urine:535; Blood:100] Total I/O In: 1818.3 [P.O.:600; I.V.:1168.3; IV Piggyback:50] Out: 925 [Urine:925]   NAD Soft approp T ND R pcn clear pink urine Foley clear pink urine  CBC    Component Value Date/Time   WBC 8.8 09/18/2015 0530   RBC 3.58* 09/18/2015 0530   HGB 11.1* 09/18/2015 0530   HCT 33.7* 09/18/2015 0530   PLT 191 09/18/2015 0530   MCV 94.1 09/18/2015 0530   MCH 31.0 09/18/2015 0530   MCHC 32.9 09/18/2015 0530   RDW 12.5 09/18/2015 0530   LYMPHSABS 1.1 09/17/2015 0830   MONOABS 0.6 09/17/2015 0830   EOSABS 0.1 09/17/2015 0830   BASOSABS 0.0 09/17/2015 0830    BMP Latest Ref Rng 09/18/2015 09/12/2015  Glucose 65 - 99 mg/dL 106(H) 106(H)  BUN 6 - 20 mg/dL 11 10  Creatinine 0.61 - 1.24 mg/dL 1.26(H) 1.04  Sodium 135 - 145 mmol/L 140 142  Potassium 3.5 - 5.1 mmol/L 4.1 4.3  Chloride 101 - 111 mmol/L 106 103  CO2 22 - 32 mmol/L 27 30  Calcium 8.9 - 10.3 mg/dL 8.0(L) 9.8    POD 1 Right PCNL. Doing well -d/c foley -cap PCN -if tolerating diet and pain controlled around noon, d/c home -f/u tomorrow for PCN removal -will need URS to remove remaining small stone burden in 4-6 weeks

## 2015-09-18 NOTE — Care Management Note (Signed)
Case Management Note  Patient Details  Name: Joel Jenkins MRN: [id number] Date of Birth: [birth year]  Subjective/Objective: 64 y/o m admitted w/Nephrolithiasis. From home.s/p L PCN.                   Action/Plan:d/c home no needs or orders.   Expected Discharge Date:                  Expected Discharge Plan:  Home/Self Care  In-House Referral:     Discharge planning Services  CM Consult  Post Acute Care Choice:    Choice offered to:     DME Arranged:    DME Agency:     HH Arranged:    [place] Agency:     Status of Service:  Completed, signed off  Medicare Important Message Given:    Date Medicare IM Given:    Medicare IM give by:    Date Additional Medicare IM Given:    Additional Medicare Important Message give by:     If discussed at [place] of Stay Meetings, dates discussed:    Additional Comments:  Dessa Phi, RN 09/18/2015, 11:51 AM

## 2015-09-18 NOTE — Progress Notes (Signed)
Patient discharged.  Educated on discharge instructions, medications, and follow-up appointment.  Prescriptions given to wife.  Belongings gathered by patient and wife.  IV removed.  Patient stated understanding and signed AVS.  Nephrostomy remains to drainage, educated wife on how to empty, signs and symptoms of infection, and when to call a doctor.  No concerns at this time.  Escorted to car via wheelchair with NT.

## 2015-09-18 NOTE — Progress Notes (Signed)
Nephrostomy tube capped per MD order. Patient with increased pain at nephrostomy site shortly after being capped. Nephrostomy tube reconnected to drainage bag. MD paged. On coming nurse made aware. Patient with improved pain since reconnected to drainage bag.

## 2015-09-18 NOTE — Discharge Summary (Signed)
Date of admission: 09/17/2015  Date of discharge: 09/18/2015  Admission diagnosis: Right staghorn calculus   Discharge diagnosis: Right staghorn calculus  Secondary diagnoses:  Patient Active Problem List   Diagnosis Date Noted  . Nephrolithiasis 09/17/2015    History and Physical: For full details, please see admission history and physical. Briefly, Joel Jenkins is a 64 y.o. year old patient with a right staghorn calculus.   Hospital Course: Patient tolerated the procedure well.  He was then transferred to the floor after an uneventful PACU stay.  His hospital course was uncomplicated.  On POD#1 he had met discharge criteria: was eating a regular diet, was up and ambulating independently,  pain was well controlled, was voiding without a catheter, and was ready to for discharge. He did not tolerate capping of his nephrostomy tubes that he was discharged home with his nephrostomy tube draining.   Laboratory values:   Recent Labs  09/17/15 0830 09/18/15 0530  WBC 4.9 8.8  HGB 14.5 11.1*  HCT 42.6 33.7*    Recent Labs  09/18/15 0530  NA 140  K 4.1  CL 106  CO2 27  GLUCOSE 106*  BUN 11  CREATININE 1.26*  CALCIUM 8.0*   No results for input(s): LABPT, INR in the last 72 hours. No results for input(s): LABURIN in the last 72 hours. Results for orders placed or performed during the hospital encounter of 09/12/15  Urine culture     Status: None   Collection Time: 09/12/15 11:13 AM  Result Value Ref Range Status   Specimen Description URINE, CLEAN CATCH  Final   Special Requests NONE  Final   Culture   Final    NO GROWTH 1 DAY Performed at [facility]    Report Status 09/13/2015 FINAL  Final    Disposition: Home  Discharge instruction: The patient was instructed to be ambulatory but told to refrain from heavy lifting, strenuous activity, or driving.   Discharge medications:   Medication List    TAKE these medications        cephALEXin 500 MG capsule   Commonly known as:  KEFLEX  Take 1 capsule (500 mg total) by mouth 4 (four) times daily.     docusate sodium 100 MG capsule  Commonly known as:  COLACE  Take 1 capsule (100 mg total) by mouth 2 (two) times daily.     FISH OIL PO  Take 1 capsule by mouth 2 (two) times daily.     HYDROcodone-acetaminophen 5-325 MG tablet  Commonly known as:  NORCO/VICODIN  Take 1-2 tablets by mouth every 4 (four) hours as needed for moderate pain.     ICAPS AREDS 2 Caps  Take 1 capsule by mouth 2 (two) times daily.     lisinopril 40 MG tablet  Commonly known as:  PRINIVIL,ZESTRIL  Take 40 mg by mouth daily.     omeprazole 20 MG tablet  Commonly known as:  PRILOSEC OTC  Take 20 mg by mouth daily.        Followup:      Follow-up Information    Follow up with Nickie Retort, MD On 09/19/2015.   Specialty:  Urology   Why:  at 2:15 for nephrostomy tube removal by Jiles Crocker, NP   Contact information:   [place] [id number] [phone]      Plan: He'll follow-up in the office tomorrow. We will attempt to clamp his nephrostomy tube for a half hour. If he  tolerates that, we'll remove his nephrostomy tube.

## 2015-09-18 NOTE — Progress Notes (Signed)
Patient had increased pain when nephrostomy tube was capped.  Reattached to drainage bag and pain was relieved.   Notified Dr. Pilar Jarvis.  Per Dr. Pilar Jarvis patient can discharge with nephrostomy to drainage and follow-up in office tomorrow (appointment already scheduled).  Notified patient and wife who are in agreement with plan.

## 2015-09-19 ENCOUNTER — Emergency Department (HOSPITAL_COMMUNITY)
Admission: EM | Admit: 2015-09-19 | Discharge: 2015-09-19 | Disposition: A | Discharge status: Home / Self Care | Payer: Commercial Managed Care - PPO | Attending: Emergency Medicine | Admitting: Emergency Medicine

## 2015-09-19 ENCOUNTER — Encounter (HOSPITAL_COMMUNITY): Payer: Self-pay | Admitting: *Deleted

## 2015-09-19 DIAGNOSIS — Z792 Long term (current) use of antibiotics: Secondary | ICD-10-CM | POA: Diagnosis not present

## 2015-09-19 DIAGNOSIS — Z87442 Personal history of urinary calculi: Secondary | ICD-10-CM | POA: Insufficient documentation

## 2015-09-19 DIAGNOSIS — I10 Essential (primary) hypertension: Secondary | ICD-10-CM | POA: Diagnosis not present

## 2015-09-19 DIAGNOSIS — Z87891 Personal history of nicotine dependence: Secondary | ICD-10-CM | POA: Insufficient documentation

## 2015-09-19 DIAGNOSIS — Z87438 Personal history of other diseases of male genital organs: Secondary | ICD-10-CM | POA: Diagnosis not present

## 2015-09-19 DIAGNOSIS — N99530 Hemorrhage of other stoma of urinary tract: Secondary | ICD-10-CM | POA: Diagnosis present

## 2015-09-19 DIAGNOSIS — N99538 Other complication of other stoma of urinary tract: Secondary | ICD-10-CM | POA: Insufficient documentation

## 2015-09-19 DIAGNOSIS — Z79899 Other long term (current) drug therapy: Secondary | ICD-10-CM | POA: Insufficient documentation

## 2015-09-19 DIAGNOSIS — K219 Gastro-esophageal reflux disease without esophagitis: Secondary | ICD-10-CM | POA: Diagnosis not present

## 2015-09-19 DIAGNOSIS — N9989 Other postprocedural complications and disorders of genitourinary system: Secondary | ICD-10-CM

## 2015-09-19 LAB — COMPREHENSIVE METABOLIC PANEL
ALT: 13 U/L — ABNORMAL LOW (ref 17–63)
ANION GAP: 9 (ref 5–15)
AST: 24 U/L (ref 15–41)
Albumin: 3.9 g/dL (ref 3.5–5.0)
Alkaline Phosphatase: 52 U/L (ref 38–126)
BILIRUBIN TOTAL: 1 mg/dL (ref 0.3–1.2)
BUN: 9 mg/dL (ref 6–20)
CHLORIDE: 104 mmol/L (ref 101–111)
CO2: 26 mmol/L (ref 22–32)
Calcium: 9.1 mg/dL (ref 8.9–10.3)
Creatinine, Ser: 0.97 mg/dL (ref 0.61–1.24)
Glucose, Bld: 104 mg/dL — ABNORMAL HIGH (ref 65–99)
POTASSIUM: 3.7 mmol/L (ref 3.5–5.1)
Sodium: 139 mmol/L (ref 135–145)
TOTAL PROTEIN: 7.1 g/dL (ref 6.5–8.1)

## 2015-09-19 LAB — CBC WITH DIFFERENTIAL/PLATELET
BASOS ABS: 0 10*3/uL (ref 0.0–0.1)
Basophils Relative: 0 %
EOS PCT: 0 %
Eosinophils Absolute: 0 10*3/uL (ref 0.0–0.7)
HCT: 36.9 % — ABNORMAL LOW (ref 39.0–52.0)
HEMOGLOBIN: 12.3 g/dL — AB (ref 13.0–17.0)
LYMPHS ABS: 1.1 10*3/uL (ref 0.7–4.0)
LYMPHS PCT: 13 %
MCH: 30.7 pg (ref 26.0–34.0)
MCHC: 33.3 g/dL (ref 30.0–36.0)
MCV: 92 fL (ref 78.0–100.0)
Monocytes Absolute: 0.6 10*3/uL (ref 0.1–1.0)
Monocytes Relative: 7 %
NEUTROS ABS: 6.6 10*3/uL (ref 1.7–7.7)
NEUTROS PCT: 80 %
PLATELETS: 235 10*3/uL (ref 150–400)
RBC: 4.01 MIL/uL — AB (ref 4.22–5.81)
RDW: 12.3 % (ref 11.5–15.5)
WBC: 8.3 10*3/uL (ref 4.0–10.5)

## 2015-09-19 NOTE — ED Notes (Addendum)
Pt states he had kidney stone surgery on Tuesday and had nephrostomy tube placed. Pt states he noticed his bandage was saturated with drainage last night. Pt states he believes the drainage from the nephrostomy tube bag has drained back though the tube while the pt was laying down. Pt states he also noticed his heartbeat in his ear yesterday. Pt told by urologist to come to ED for evaluation.

## 2015-09-19 NOTE — ED Provider Notes (Signed)
CSN: [id number]     Arrival date & time 09/19/15  1213 History   First MD Initiated Contact with Patient 09/19/15 1348     Chief Complaint  Patient presents with  . Post Op Bleeding       HPI Pt states he had kidney stone surgery on Tuesday and had nephrostomy tube placed. Pt states he noticed his bandage was saturated with drainage last night. Pt states he believes the drainage from the nephrostomy tube bag has drained back though the tube while the pt was laying down. Pt states he also noticed his heartbeat in his ear yesterday. Pt told by urologist to come to ED for evaluation. Past Medical History  Diagnosis Date  . Hypertension   . Shortness of breath dyspnea     WITH EXERTION  . GERD (gastroesophageal reflux disease)   . History of kidney stones   . Enlarged prostate    Past Surgical History  Procedure Laterality Date  . Cystoscopy  1980    TO REMOVE KIDNEY STONE  . Nephrolithotomy Right 09/17/2015    Procedure: RIGHT  PERCUTANEOUS NEPHROLITHOTOMY ;  Surgeon: Nickie Retort, MD;  Location: [facility];  Service: Urology;  Laterality: Right;   No family history on file. Social History  Substance Use Topics  . Smoking status: Former Smoker    Quit date: 09/12/2011  . Smokeless tobacco: None  . Alcohol Use: No    Review of Systems All other systems reviewed and are negative   Allergies  Review of patient's allergies indicates no known allergies.  Home Medications   Prior to Admission medications   Medication Sig Start Date End Date Taking? Authorizing Provider  cephALEXin (KEFLEX) 500 MG capsule Take 1 capsule (500 mg total) by mouth 4 (four) times daily. 09/18/15  Yes Nickie Retort, MD  docusate sodium (COLACE) 100 MG capsule Take 1 capsule (100 mg total) by mouth 2 (two) times daily. 09/18/15  Yes Nickie Retort, MD  HYDROcodone-acetaminophen (NORCO/VICODIN) 5-325 MG tablet Take 1-2 tablets by mouth every 4 (four) hours as needed for moderate pain.  09/18/15  Yes Nickie Retort, MD  lisinopril (PRINIVIL,ZESTRIL) 40 MG tablet Take 40 mg by mouth daily.   Yes Historical Provider, MD  Multiple Vitamins-Minerals (ICAPS AREDS 2) CAPS Take 1 capsule by mouth 2 (two) times daily.   Yes Historical Provider, MD  Omega-3 Fatty Acids (FISH OIL PO) Take 1 capsule by mouth 2 (two) times daily.   Yes Historical Provider, MD  omeprazole (PRILOSEC OTC) 20 MG tablet Take 20 mg by mouth daily.   Yes Historical Provider, MD   BP 123/72 mmHg  Pulse 72  Temp(Src) 98.4 F (36.9 C) (Oral)  Resp 18  SpO2 94% Physical Exam Physical Exam  Nursing note and vitals reviewed. Constitutional: He is oriented to person, place, and time. He appears well-developed and well-nourished. No distress.  HENT:  Head: Normocephalic and atraumatic.  Eyes: Pupils are equal, round, and reactive to light.  Neck: Normal range of motion. Thorax: Large perinephric bandage on right side noted.  A drainage tube that had been placed yesterday was kinked because of the way was placed.  The tube was straightened out (draining.  There had been staining of the bandage which will need to be replaced.   Cardiovascular: Normal rate and intact distal pulses.   Pulmonary/Chest: No respiratory distress.  Abdominal: Normal appearance. He exhibits no distension.  Musculoskeletal: Normal range of motion.  Neurological: He is alert and oriented  to person, place, and time. No cranial nerve deficit.  Skin: Skin is warm and dry. No rash noted.    ED Course  Procedures (including critical care time) Labs Review Labs Reviewed  COMPREHENSIVE METABOLIC PANEL - Abnormal; Notable for the following:    Glucose, Bld 104 (*)    ALT 13 (*)    All other components within normal limits  CBC WITH DIFFERENTIAL/PLATELET - Abnormal; Notable for the following:    RBC 4.01 (*)    Hemoglobin 12.3 (*)    HCT 36.9 (*)    All other components within normal limits   Plan at this time is to replace the  bandage and then secure this evening where we'll not think further.  This should solve his problem.  MDM   Final diagnoses:  Other postoperative complication involving genitourinary system        Leonard Schwartz, MD 09/22/15 2309

## 2015-09-19 NOTE — ED Notes (Signed)
MD at bedside. 

## 2015-09-19 NOTE — ED Notes (Signed)
Patient has bandage covering nephrostomy that was done Tuesday.  Bandage is saturated with serous colored drainage.  Patient has drainage into tube and bag.  Patients' wife states that emptied 50 CC from bag this morning and patient currently has 200 CC in bag.

## 2015-09-19 NOTE — ED Notes (Signed)
Patient d/c'd self care.  F/U and medications discussed.  Patient and wife verbalized understanding.

## 2015-09-19 NOTE — Progress Notes (Signed)
ED Cm spoke with EDP, Joel Jenkins about pt return to ED after d/c on 09/18/15  After EDP evaluation of wound and bandage, EDP and CM discuss no need for home health will be needed- Noted tubing kink that is causing draining to saturate bandage  Will re route tubing in [facility] ED and change dressing

## 2015-09-19 NOTE — ED Notes (Signed)
Ambulated to bathroom. Steady gait.

## 2015-10-02 ENCOUNTER — Other Ambulatory Visit: Payer: Self-pay | Admitting: Urology

## 2015-10-11 ENCOUNTER — Encounter (HOSPITAL_BASED_OUTPATIENT_CLINIC_OR_DEPARTMENT_OTHER): Payer: Self-pay | Admitting: *Deleted

## 2015-10-11 NOTE — Progress Notes (Signed)
NPO AFTER MN.  ARRIVE AT 0730.  GETTING LAB WORK DONE MON. 10-14-2015.  CURRENT EKG IN CHART AND EPIC.  WILL TAKE PRILOSEC AM DOS W/ SIPS OF WATER.

## 2015-10-14 DIAGNOSIS — K219 Gastro-esophageal reflux disease without esophagitis: Secondary | ICD-10-CM | POA: Diagnosis not present

## 2015-10-14 DIAGNOSIS — R350 Frequency of micturition: Secondary | ICD-10-CM | POA: Diagnosis not present

## 2015-10-14 DIAGNOSIS — I10 Essential (primary) hypertension: Secondary | ICD-10-CM | POA: Diagnosis not present

## 2015-10-14 DIAGNOSIS — N200 Calculus of kidney: Secondary | ICD-10-CM | POA: Diagnosis present

## 2015-10-14 DIAGNOSIS — R3121 Asymptomatic microscopic hematuria: Secondary | ICD-10-CM | POA: Diagnosis not present

## 2015-10-14 DIAGNOSIS — E7800 Pure hypercholesterolemia, unspecified: Secondary | ICD-10-CM | POA: Diagnosis not present

## 2015-10-14 DIAGNOSIS — Z936 Other artificial openings of urinary tract status: Secondary | ICD-10-CM | POA: Diagnosis not present

## 2015-10-14 DIAGNOSIS — Z841 Family history of disorders of kidney and ureter: Secondary | ICD-10-CM | POA: Diagnosis not present

## 2015-10-14 DIAGNOSIS — Z87891 Personal history of nicotine dependence: Secondary | ICD-10-CM | POA: Diagnosis not present

## 2015-10-14 LAB — CBC
HCT: 43.9 % (ref 39.0–52.0)
HEMOGLOBIN: 14.8 g/dL (ref 13.0–17.0)
MCH: 31.4 pg (ref 26.0–34.0)
MCHC: 33.7 g/dL (ref 30.0–36.0)
MCV: 93 fL (ref 78.0–100.0)
Platelets: 257 10*3/uL (ref 150–400)
RBC: 4.72 MIL/uL (ref 4.22–5.81)
RDW: 12.7 % (ref 11.5–15.5)
WBC: 5.3 10*3/uL (ref 4.0–10.5)

## 2015-10-14 LAB — BASIC METABOLIC PANEL
ANION GAP: 10 (ref 5–15)
BUN: 12 mg/dL (ref 6–20)
CALCIUM: 9.6 mg/dL (ref 8.9–10.3)
CO2: 29 mmol/L (ref 22–32)
CREATININE: 1.11 mg/dL (ref 0.61–1.24)
Chloride: 105 mmol/L (ref 101–111)
GLUCOSE: 111 mg/dL — AB (ref 65–99)
Potassium: 4.1 mmol/L (ref 3.5–5.1)
Sodium: 144 mmol/L (ref 135–145)

## 2015-10-21 ENCOUNTER — Ambulatory Visit (HOSPITAL_BASED_OUTPATIENT_CLINIC_OR_DEPARTMENT_OTHER)
Admission: RE | Admit: 2015-10-21 | Discharge: 2015-10-21 | Disposition: A | Discharge status: Home / Self Care | Payer: Commercial Managed Care - PPO | Source: Ambulatory Visit | Attending: Urology | Admitting: Urology

## 2015-10-21 ENCOUNTER — Encounter (HOSPITAL_BASED_OUTPATIENT_CLINIC_OR_DEPARTMENT_OTHER)
Admission: RE | Disposition: A | Discharge status: Home / Self Care | Payer: Self-pay | Source: Ambulatory Visit | Attending: Urology

## 2015-10-21 ENCOUNTER — Ambulatory Visit (HOSPITAL_BASED_OUTPATIENT_CLINIC_OR_DEPARTMENT_OTHER): Payer: Commercial Managed Care - PPO | Admitting: Anesthesiology

## 2015-10-21 ENCOUNTER — Encounter (HOSPITAL_BASED_OUTPATIENT_CLINIC_OR_DEPARTMENT_OTHER): Payer: Self-pay | Admitting: *Deleted

## 2015-10-21 DIAGNOSIS — R350 Frequency of micturition: Secondary | ICD-10-CM | POA: Insufficient documentation

## 2015-10-21 DIAGNOSIS — I10 Essential (primary) hypertension: Secondary | ICD-10-CM | POA: Insufficient documentation

## 2015-10-21 DIAGNOSIS — Z87891 Personal history of nicotine dependence: Secondary | ICD-10-CM | POA: Insufficient documentation

## 2015-10-21 DIAGNOSIS — R3121 Asymptomatic microscopic hematuria: Secondary | ICD-10-CM | POA: Insufficient documentation

## 2015-10-21 DIAGNOSIS — K219 Gastro-esophageal reflux disease without esophagitis: Secondary | ICD-10-CM | POA: Insufficient documentation

## 2015-10-21 DIAGNOSIS — N200 Calculus of kidney: Secondary | ICD-10-CM | POA: Insufficient documentation

## 2015-10-21 DIAGNOSIS — E7800 Pure hypercholesterolemia, unspecified: Secondary | ICD-10-CM | POA: Insufficient documentation

## 2015-10-21 DIAGNOSIS — Z936 Other artificial openings of urinary tract status: Secondary | ICD-10-CM | POA: Insufficient documentation

## 2015-10-21 DIAGNOSIS — Z841 Family history of disorders of kidney and ureter: Secondary | ICD-10-CM | POA: Insufficient documentation

## 2015-10-21 HISTORY — PX: CYSTOSCOPY WITH URETEROSCOPY AND STENT PLACEMENT: SHX6377

## 2015-10-21 HISTORY — PX: HOLMIUM LASER APPLICATION: SHX5852

## 2015-10-21 HISTORY — DX: Benign prostatic hyperplasia without lower urinary tract symptoms: N40.0

## 2015-10-21 HISTORY — DX: Urgency of urination: R39.15

## 2015-10-21 HISTORY — DX: Preglaucoma, unspecified, unspecified eye: H40.009

## 2015-10-21 HISTORY — DX: Presence of spectacles and contact lenses: Z97.3

## 2015-10-21 HISTORY — DX: Calculus of kidney: N20.0

## 2015-10-21 SURGERY — CYSTOURETEROSCOPY, WITH STENT INSERTION
Anesthesia: General | Site: Ureter | Laterality: Right

## 2015-10-21 MED ORDER — MIDAZOLAM HCL 2 MG/2ML IJ SOLN
INTRAMUSCULAR | Status: AC
  Filled 2015-10-21: qty 2

## 2015-10-21 MED ORDER — FENTANYL CITRATE (PF) 100 MCG/2ML IJ SOLN
INTRAMUSCULAR | Status: AC
  Filled 2015-10-21: qty 2

## 2015-10-21 MED ORDER — KETOROLAC TROMETHAMINE 30 MG/ML IJ SOLN
INTRAMUSCULAR | Status: DC | PRN
  Administered 2015-10-21: 30 mg via INTRAVENOUS

## 2015-10-21 MED ORDER — SODIUM CHLORIDE 0.9 % IR SOLN
Status: DC | PRN
Start: 2015-10-21 — End: 2015-10-21
  Administered 2015-10-21: 3000 mL
  Administered 2015-10-21: 1000 mL

## 2015-10-21 MED ORDER — IOHEXOL 350 MG/ML SOLN
INTRAVENOUS | Status: DC | PRN
  Administered 2015-10-21: 10 mL

## 2015-10-21 MED ORDER — PROMETHAZINE HCL 25 MG/ML IJ SOLN
6.2500 mg | INTRAMUSCULAR | Status: DC | PRN
  Filled 2015-10-21: qty 1

## 2015-10-21 MED ORDER — PROPOFOL 10 MG/ML IV BOLUS
INTRAVENOUS | Status: DC | PRN
Start: 2015-10-21 — End: 2015-10-21
  Administered 2015-10-21: 200 mg via INTRAVENOUS

## 2015-10-21 MED ORDER — HYDROMORPHONE HCL 1 MG/ML IJ SOLN
0.2500 mg | INTRAMUSCULAR | Status: DC | PRN
  Filled 2015-10-21: qty 1

## 2015-10-21 MED ORDER — CEPHALEXIN 500 MG PO CAPS: 500.0000 mg | ORAL_CAPSULE | Freq: Three times a day (TID) | ORAL | Status: DC

## 2015-10-21 MED ORDER — PROPOFOL 10 MG/ML IV BOLUS
INTRAVENOUS | Status: AC
  Filled 2015-10-21: qty 20

## 2015-10-21 MED ORDER — CEFAZOLIN SODIUM-DEXTROSE 2-3 GM-% IV SOLR
2.0000 g | INTRAVENOUS | Status: AC
  Administered 2015-10-21: 2 g via INTRAVENOUS
  Filled 2015-10-21: qty 50

## 2015-10-21 MED ORDER — ONDANSETRON HCL 4 MG/2ML IJ SOLN
INTRAMUSCULAR | Status: AC
  Filled 2015-10-21: qty 2

## 2015-10-21 MED ORDER — ONDANSETRON HCL 4 MG/2ML IJ SOLN
INTRAMUSCULAR | Status: DC | PRN
  Administered 2015-10-21: 4 mg via INTRAVENOUS

## 2015-10-21 MED ORDER — LACTATED RINGERS IV SOLN
INTRAVENOUS | Status: DC
  Administered 2015-10-21: 08:00:00 via INTRAVENOUS
  Filled 2015-10-21: qty 1000

## 2015-10-21 MED ORDER — HYDROCODONE-ACETAMINOPHEN 5-325 MG PO TABS: 1.0000 | ORAL_TABLET | ORAL | Status: DC | PRN

## 2015-10-21 MED ORDER — LIDOCAINE HCL (CARDIAC) 20 MG/ML IV SOLN
INTRAVENOUS | Status: DC | PRN
  Administered 2015-10-21: 100 mg via INTRAVENOUS

## 2015-10-21 MED ORDER — FENTANYL CITRATE (PF) 100 MCG/2ML IJ SOLN
INTRAMUSCULAR | Status: DC | PRN
  Administered 2015-10-21 (×4): 25 ug via INTRAVENOUS

## 2015-10-21 MED ORDER — DEXAMETHASONE SODIUM PHOSPHATE 10 MG/ML IJ SOLN
INTRAMUSCULAR | Status: AC
  Filled 2015-10-21: qty 1

## 2015-10-21 MED ORDER — CEFAZOLIN SODIUM-DEXTROSE 2-3 GM-% IV SOLR
INTRAVENOUS | Status: AC
  Filled 2015-10-21: qty 50

## 2015-10-21 MED ORDER — DEXAMETHASONE SODIUM PHOSPHATE 10 MG/ML IJ SOLN
INTRAMUSCULAR | Status: DC | PRN
  Administered 2015-10-21: 10 mg via INTRAVENOUS

## 2015-10-21 MED ORDER — MIDAZOLAM HCL 5 MG/5ML IJ SOLN
INTRAMUSCULAR | Status: DC | PRN
  Administered 2015-10-21: 2 mg via INTRAVENOUS

## 2015-10-21 SURGICAL SUPPLY — 30 items
BAG URO CATCHER STRL LF (MISCELLANEOUS) ×3 IMPLANT
CATH INTERMIT  6FR 70CM (CATHETERS) ×3 IMPLANT
CATH URET 5FR 28IN OPEN ENDED (CATHETERS) ×3 IMPLANT
CLOTH BEACON ORANGE TIMEOUT ST (SAFETY) ×3 IMPLANT
FIBER LASER FLEXIVA 365 (UROLOGICAL SUPPLIES) ×3 IMPLANT
FIBER LASER TRAC TIP (UROLOGICAL SUPPLIES) IMPLANT
GLOVE BIO SURGEON STRL SZ 6.5 (GLOVE) ×2 IMPLANT
GLOVE BIO SURGEON STRL SZ7.5 (GLOVE) ×3 IMPLANT
GLOVE BIO SURGEONS STRL SZ 6.5 (GLOVE) ×1
GLOVE BIOGEL PI IND STRL 7.5 (GLOVE) ×2 IMPLANT
GLOVE BIOGEL PI INDICATOR 7.5 (GLOVE) ×4
GLOVE INDICATOR 6.5 STRL GRN (GLOVE) ×3 IMPLANT
GOWN STRL REUS W/ TWL LRG LVL3 (GOWN DISPOSABLE) ×1 IMPLANT
GOWN STRL REUS W/ TWL XL LVL3 (GOWN DISPOSABLE) ×1 IMPLANT
GOWN STRL REUS W/TWL LRG LVL3 (GOWN DISPOSABLE) ×2
GOWN STRL REUS W/TWL XL LVL3 (GOWN DISPOSABLE) ×2
GUIDEWIRE STR DUAL SENSOR (WIRE) ×3 IMPLANT
IV NS 1000ML (IV SOLUTION) ×2
IV NS 1000ML BAXH (IV SOLUTION) ×1 IMPLANT
IV NS IRRIG 3000ML ARTHROMATIC (IV SOLUTION) ×3 IMPLANT
KIT ROOM TURNOVER WOR (KITS) ×3 IMPLANT
MANIFOLD NEPTUNE II (INSTRUMENTS) ×3 IMPLANT
NS IRRIG 500ML POUR BTL (IV SOLUTION) IMPLANT
PACK CYSTOSCOPY (CUSTOM PROCEDURE TRAY) ×3 IMPLANT
SCRUB PCMX 4 OZ (MISCELLANEOUS) ×3 IMPLANT
SHEATH ACCESS URETERAL 38CM (SHEATH) ×3 IMPLANT
STENT URET 6FRX26 CONTOUR (STENTS) ×3 IMPLANT
SYRINGE IRR TOOMEY STRL 70CC (SYRINGE) ×3 IMPLANT
TUBE CONNECTING 12'X1/4 (SUCTIONS) ×1
TUBE CONNECTING 12X1/4 (SUCTIONS) ×2 IMPLANT

## 2015-10-21 NOTE — H&P (Signed)
Chief Complaint    Right Renal Stone    Referring: Vassie Loll Eyk   History of Present Illness        The patient is a 65 year old gentleman with a history of nephrolithiasis who presents today for nephrolithiasis. The stone was seen on a x-ray obtained by his orthopedist. The films are not available for review so it's unclear how largest stone is. However, his orthopedist and his primary care doctor referred to it as a staghorn calculus. He has had multiple stones in the past. In his 77s he had an open ureterolithotomy. He also has passed stones in the past.    I discussed the risks, benefits, and indications of PSA screening with the patient. At this time he does not want to receive PSA testing.    Interval history:  The patient returns today to discuss a right PCNL. He is unwell since last visit. He denies any flank pain or recurrent urinary tract infections.     January 2017 interval history:  The patient returns today after his right PCNL. During that procedure, the majority of the stone burden was removed including that in the inferior and upper pole. He had some residual fragment in the middle pole that was unable to be accessed during that surgery. He still has a right ureteral stent. His right percutaneous nephrostomy tube since been removed.    His only complaints since surgery is urinary frequency that was not present prior to stent placement. He feels that he empties his bladder, however he often has to void a small amount about 30 minutes later.    He states that his incision from his nephrostomy tube has stopped draining.    The patient notes no other changes to her gastrointestinal symptoms.   Past Medical History Problems  1. History of heartburn (Z87.898) 2. History of hypercholesterolemia (Z86.39) 3. History of hypertension (Z86.79)  Surgical History Problems  1. History of Cystoscopy With Ureteroscopy With Manipulation Of Calculus 2. History of  Kidney Surgery  Current Meds 1. Lisinopril 40 MG Oral Tablet;  Therapy: (Recorded:18Oct2016) to Recorded 2. PriLOSEC 20 MG Oral Capsule Delayed Release;  Therapy: (Recorded:18Oct2016) to Recorded  Allergies Medication  1. No Known Drug Allergies  Family History Problems  1. Family history of heart failure (Z82.49) : Mother 2. Family history of kidney stones (Z84.1) : Sister 3. Family history of pneumonia (Z83.1) : Father  Social History Problems  1. Denied: History of Alcohol use 2. Caffeine use (F15.90)   3 3. Former smoker [phone])   2 pks for 20 yrs / quit 10 yrs ago 4. Married 5. Number of children   2 daughters 6. Occupation   truck Research officer, political party Vital Signs [Data Includes: Last 1 Day]  Recorded: [id number] 02:03PM  Blood Pressure: 153 / 88 Temperature: 97.3 F Heart Rate: 56  Physical Exam Constitutional: Well nourished . No acute distress.  ENT:. The ears and nose are normal in appearance.  Neck: The appearance of the neck is normal.  Pulmonary: No respiratory distress.  Cardiovascular:. No peripheral edema.  Abdomen: Incision site(s) well healed. The abdomen is soft and nontender.    Results/Data Urine [Data Includes: Last 1 Day]   [id number]  COLOR YELLOW   APPEARANCE CLOUDY   SPECIFIC GRAVITY 1.015   pH 7.0   GLUCOSE NEGATIVE   BILIRUBIN NEGATIVE   KETONE NEGATIVE   BLOOD 3+   PROTEIN 1+   NITRITE NEGATIVE   LEUKOCYTE ESTERASE 2+   SQUAMOUS EPITHELIAL/HPF  0-5 HPF  WBC 10-20 WBC/HPF  RBC 10-20 RBC/HPF  BACTERIA NONE SEEN HPF  CRYSTALS See Below HPF  CASTS NONE SEEN LPF  Other    Yeast NONE SEEN HPF    KUB:  Right ureteral stent in place. The known right mid ureteral stone is unchanged. There are stone fragments in the lower pole as well as near the UPJ on the right.1    1 Amended By: Baruch Gouty; Sep 30 2015 2:19 PM EST  Procedure PVR: 64 cc   Assessment Assessed  1. Nephrolithiasis (N20.0) 2. Asymptomatic microscopic  hematuria (R31.21) 3. Increased urinary frequency (R35.0)  Plan Asymptomatic microscopic hematuria  1. Follow-up Schedule Surgery Office  Foster Simpson will call patient to schedule surgery  Status:  Hold For - Appointment  Requested for: [id number] Health Maintenance  2. UA With REFLEX; [Do Not Release]; Status:Complete;   [id number] 01:38PM Nephrolithiasis  3. KUB; Status:Complete;   [id number] 01:48PM 4. URINE CULTURE; Status:Canceled - Specimen/Data Collection,Appointment;   I discussed the risks, benefits, indications of ureteroscopy to clear the remaining stone burden with the patient. He understands the risks include but aren't limited to bleeding, infection, and repeat surgery. He understands this and all questions were addressed. He has elected to proceed.         1. Residual right renal stone fragments status post right percutaneous nephrolithotomy  -We'll schedule for cystoscopy, right ureteroscopy, laser lithotripsy, right ureteral stent exchange in a few weeks to allow additional healing of his PCNL tract  -Urine culture sent today    2. Urinary frequency secondary to right ureteral stent  I gave the patient one month supply of 25 mg Myrbetriq to be taken daily. 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    3. Microhematuria  -We will treat his stone disease and recheck a urinalysis. If he continues to have microhematuria at that time, we will do a formal hematuria workup.

## 2015-10-21 NOTE — Transfer of Care (Signed)
Immediate Anesthesia Transfer of Care Note  Patient: Joel Jenkins  Procedure(s) Performed: Procedure(s) (LRB): CYSTOSCOPY RIGHT URETEROSCOPY RIGHT STENT EXCHANGE (Right) HOLMIUM LASER APPLICATION (Right)  Patient Location: PACU  Anesthesia Type: General  Level of Consciousness: awake, sedated, patient cooperative and responds to stimulation  Airway & Oxygen Therapy: Patient Spontanous Breathing and Patient connected to face mask oxygen  Post-op Assessment: Report given to PACU RN, Post -op Vital signs reviewed and stable and Patient moving all extremities  Post vital signs: Reviewed and stable  Complications: No apparent anesthesia complications

## 2015-10-21 NOTE — Anesthesia Preprocedure Evaluation (Signed)
Anesthesia Evaluation  Patient identified by MRN, date of birth, ID band Patient awake    Reviewed: Allergy & Precautions, NPO status , Patient's Chart, lab work & pertinent test results  Airway Mallampati: II  TM Distance: >3 FB Neck ROM: Full    Dental no notable dental hx.    Pulmonary former smoker,    Pulmonary exam normal breath sounds clear to auscultation       Cardiovascular hypertension, Pt. on medications Normal cardiovascular exam Rhythm:Regular Rate:Normal     Neuro/Psych negative neurological ROS  negative psych ROS   GI/Hepatic Neg liver ROS, GERD  Medicated,  Endo/Other  negative endocrine ROS  Renal/GU negative Renal ROS  negative genitourinary   Musculoskeletal negative musculoskeletal ROS (+)   Abdominal   Peds negative pediatric ROS (+)  Hematology negative hematology ROS (+)   Anesthesia Other Findings   Reproductive/Obstetrics negative OB ROS                             Anesthesia Physical Anesthesia Plan  ASA: II  Anesthesia Plan: General   Post-op Pain Management:    Induction: Intravenous  Airway Management Planned: LMA  Additional Equipment:   Intra-op Plan:   Post-operative Plan: Extubation in OR  Informed Consent: I have reviewed the patients History and Physical, chart, labs and discussed the procedure including the risks, benefits and alternatives for the proposed anesthesia with the patient or authorized representative who has indicated his/her understanding and acceptance.   Dental advisory given  Plan Discussed with: CRNA and Surgeon  Anesthesia Plan Comments:         Anesthesia Quick Evaluation

## 2015-10-21 NOTE — Anesthesia Postprocedure Evaluation (Signed)
Anesthesia Post Note  Patient: CRISTOVAL SHINES  Procedure(s) Performed: Procedure(s) (LRB): CYSTOSCOPY RIGHT URETEROSCOPY RIGHT STENT EXCHANGE (Right) HOLMIUM LASER APPLICATION (Right)  Patient location during evaluation: PACU Anesthesia Type: General Level of consciousness: awake and alert Pain management: pain level controlled Vital Signs Assessment: post-procedure vital signs reviewed and stable Respiratory status: spontaneous breathing, nonlabored ventilation, respiratory function stable and patient connected to nasal cannula oxygen Cardiovascular status: blood pressure returned to baseline and stable Postop Assessment: no signs of nausea or vomiting Anesthetic complications: no    Last Vitals:  Filed Vitals:   10/21/15 1017 10/21/15 1030  BP: 131/74 104/66  Pulse: 58 56  Temp: 36.6 C   Resp: 11 12    Last Pain:  Filed Vitals:   10/21/15 1033  PainSc: Asleep                 Rayland Hamed S

## 2015-10-21 NOTE — Anesthesia Procedure Notes (Signed)
Procedure Name: LMA Insertion Date/Time: 10/21/2015 9:27 AM Performed by: Justice Rocher Pre-anesthesia Checklist: Patient identified, Emergency Drugs available, Suction available and Patient being monitored Patient Re-evaluated:Patient Re-evaluated prior to inductionOxygen Delivery Method: Circle System Utilized Preoxygenation: Pre-oxygenation with 100% oxygen Intubation Type: IV induction Ventilation: Mask ventilation without difficulty LMA: LMA inserted LMA Size: 4.0 Number of attempts: 1 Airway Equipment and Method: Bite block Placement Confirmation: positive ETCO2 Tube secured with: Tape Dental Injury: Teeth and Oropharynx as per pre-operative assessment

## 2015-10-21 NOTE — Discharge Instructions (Signed)

## 2015-10-21 NOTE — Op Note (Signed)
Date of procedure: 10/21/2015  Preoperative diagnosis:  1. Right staghorn calculus  Postoperative diagnosis:  1. Right staghorn calculus   Procedure: 1. Cystoscopy 2. Right retrograde pyelogram with interpretation 3. Right ureteroscopy 4. Laser lithotripsy 5. Right ureteral stent exchange 6 [place] by 26 cm  Surgeon: Baruch Gouty, MD  Anesthesia: General  Complications: None  Intraoperative findings: There was a mid pole calculus as previously seen on KUB that was dusted. There is also small calculi in the inferior polar also dusted. Retrograde pyelogram and procedure showed no residual filling defects concerning for large stone burden. Fluoroscopy confirmed the stent to be correctly placed.  EBL: None  Specimens: None  Drains: 6 French by 26 cm double-J ureteral stent  Disposition: Stable to the postanesthesia care unit  Indication for procedure: The patient is a 65 y.o. male with history of a right staghorn calculus who recently underwent percutaneous nephrolithotomy who presents for ureteroscopy to remove residual stone fragment mainly located in the middle pole.  After reviewing the management options for treatment, the patient elected to proceed with the above surgical procedure(s). We have discussed the potential benefits and risks of the procedure, side effects of the proposed treatment, the likelihood of the patient achieving the goals of the procedure, and any potential problems that might occur during the procedure or recuperation. Informed consent has been obtained.  Description of procedure: The patient was met in the preoperative area. All risks, benefits, and indications of the procedure were described in great detail. The patient consented to the procedure. Preoperative antibiotics were given. The patient was taken to the operative theater. General anesthesia was induced per the anesthesia service. The patient was then placed in the dorsal lithotomy position and  prepped and draped in the usual sterile fashion. A preoperative timeout was called.    A 21 30 cystoscope was inserted into the patient's bladder per urethra atraumatically. The right ureteral stent was then grasped flexible graspers brought to level the urethral meatus. A sensor wire was exchanged through the ureteral stent, the stent was removed. A dual-lumen catheter was placed over the sensor wire second sensor wire was placed under fluoroscopy to the level of the renal pelvis. The balloon catheters removed. One sensor wire was affixed to the drape. The remaining sensor wire had a ureteral access sheath placed over it under fluoroscopy atraumatically. The sensor wire and inner sheath removed. A flexible ureteroscope was inserted in the patient's right renal pelvis through the access sheath. The known stone burden in the middle pole was visualized. It was dusted with the laser. There is no large fragments. There were two small stones in the lower pole that were also dusted. Pan nephroscopy at this point showed no large stone fragments. Fluoroscopy was unremarkable for any stones that were previously seen prior to procedure.  Retro-powder was obtained through the ureteroscope which showed no other filling defects. The ureteroscope and sheath removed under direct visualization showing no large fragments. The decision was not to send any stones at this time as the patient recent stone analysis at his last procedure. The cystoscope was reassembled over the sensor wire. 6 [place] by 26 over double-J ureter stent was placed with sensor wire. The sensor wire was removed. The stent was confirmed to be in the correct position and a curl seen in the patient's renal pelvis on fluoroscopy and a curl seen in the patient's urinary bladder under direct visualization. The patient's bladder was then drained. The cystoscope was removed. The patient was  transferred stable condition to postanesthesia care unit without any  apparent complications.   Plan: The patient will follow-up in one week for stent removal. He will receive a renal ultrasound 1 month later to rule out iatrogenic hydronephrosis.  Baruch Gouty, M.D.

## 2015-10-22 ENCOUNTER — Encounter (HOSPITAL_BASED_OUTPATIENT_CLINIC_OR_DEPARTMENT_OTHER): Payer: Self-pay | Admitting: Urology

## 2015-12-25 ENCOUNTER — Other Ambulatory Visit: Payer: Self-pay | Admitting: Urology

## 2016-05-06 DIAGNOSIS — H353130 Nonexudative age-related macular degeneration, bilateral, stage unspecified: Secondary | ICD-10-CM | POA: Diagnosis not present

## 2016-05-06 DIAGNOSIS — H1851 Endothelial corneal dystrophy: Secondary | ICD-10-CM | POA: Diagnosis not present

## 2016-05-06 DIAGNOSIS — Z961 Presence of intraocular lens: Secondary | ICD-10-CM | POA: Diagnosis not present

## 2016-05-06 DIAGNOSIS — H26493 Other secondary cataract, bilateral: Secondary | ICD-10-CM | POA: Diagnosis not present

## 2016-06-22 DIAGNOSIS — I10 Essential (primary) hypertension: Secondary | ICD-10-CM | POA: Diagnosis not present

## 2017-01-04 DIAGNOSIS — H26493 Other secondary cataract, bilateral: Secondary | ICD-10-CM | POA: Diagnosis not present

## 2017-01-04 DIAGNOSIS — H353130 Nonexudative age-related macular degeneration, bilateral, stage unspecified: Secondary | ICD-10-CM | POA: Diagnosis not present

## 2017-01-04 DIAGNOSIS — H1851 Endothelial corneal dystrophy: Secondary | ICD-10-CM | POA: Diagnosis not present

## 2017-01-04 DIAGNOSIS — Z961 Presence of intraocular lens: Secondary | ICD-10-CM | POA: Diagnosis not present

## 2017-03-30 DIAGNOSIS — N200 Calculus of kidney: Secondary | ICD-10-CM | POA: Diagnosis not present

## 2017-04-18 IMAGING — XA IR URETURAL STENT RIGHT NEW ACCESS W/O SEP NEPHROSTOMY CATH
2 series · 7 of 7 positions shown · non-contrast
Comparison: CT of the abdomen and pelvis on 07/09/2015

CLINICAL DATA: Large staghorn calculus of the right kidney. The
patient requires percutaneous access of the collecting system and
ureter prior to planned operative percutaneous nephrolithotomy later
today.

EXAM:
1. PLACEMENT OF RIGHT URETERAL CATHETER VIA PERCUTANEOUS ACCESS OF
THE RIGHT RENAL COLLECTING SYSTEM

[Series 3: care single · 1 of 1 slices shown]
[im 1/1]
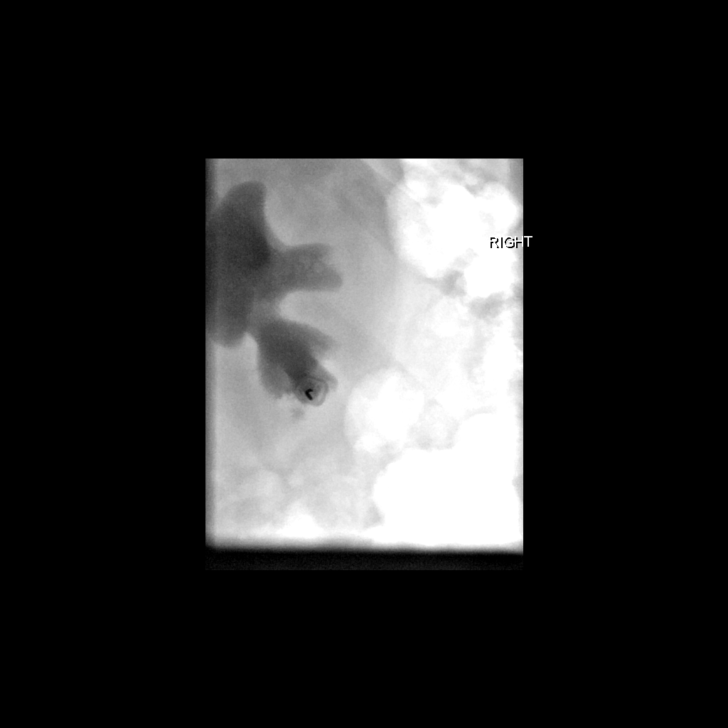

[Series 300: ir ureteral stent right new access w/o s · non-contrast · 6 of 6 slices shown]
[im 1/6]
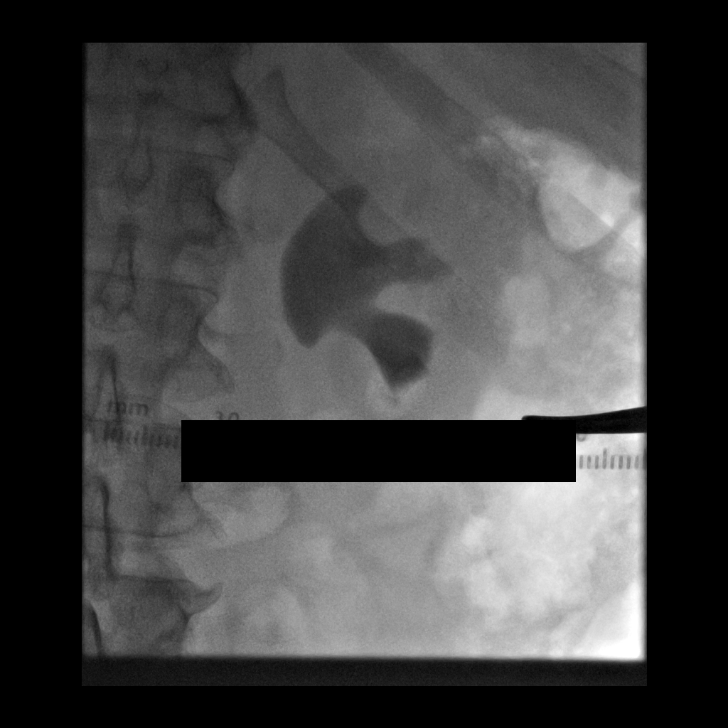
[im 2/6]
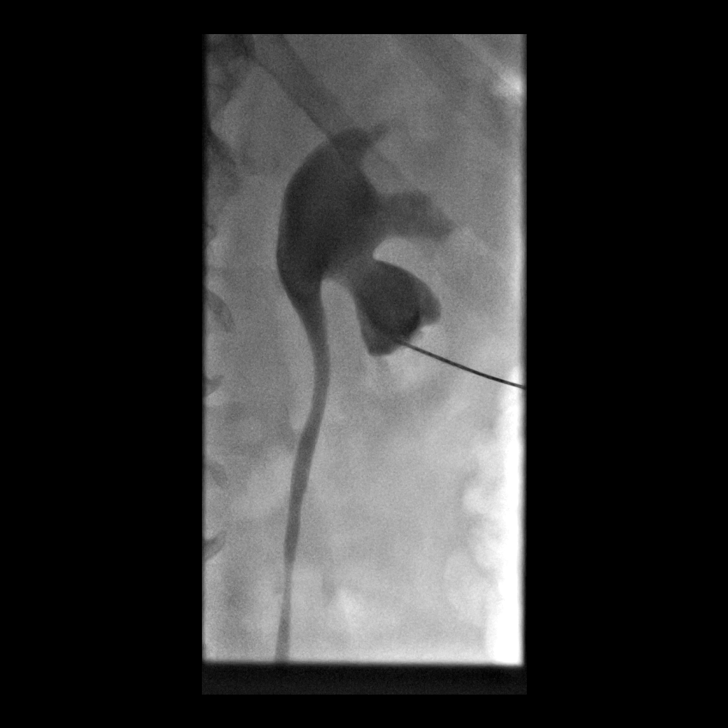
[im 3/6]
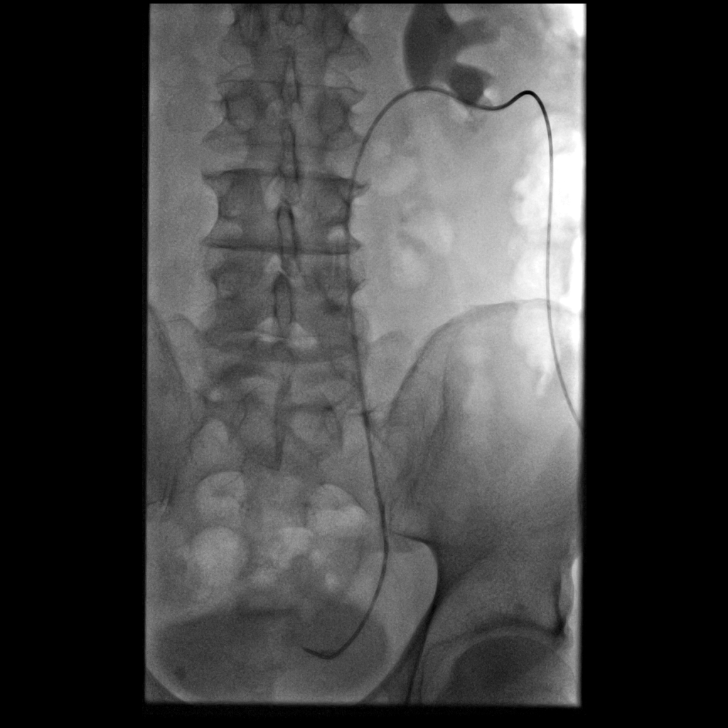
[im 4/6]
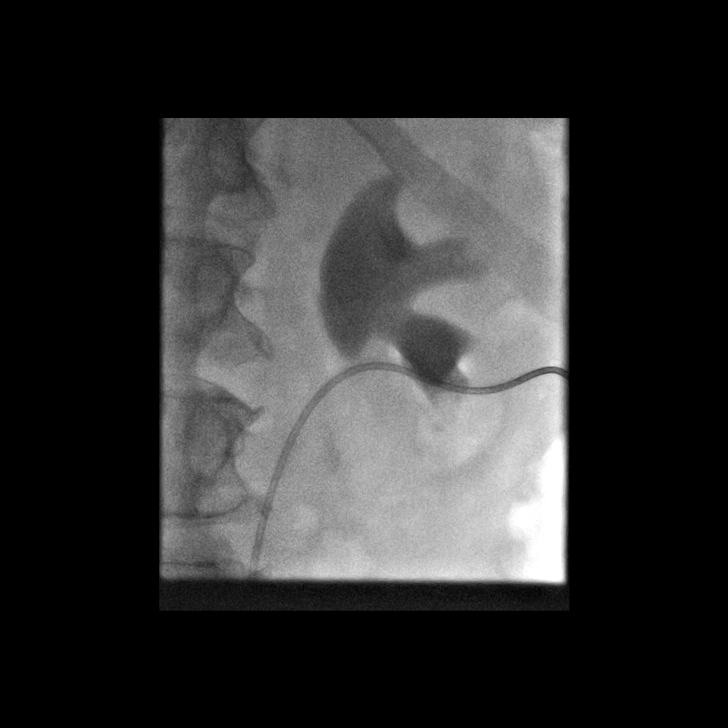
[im 5/6]
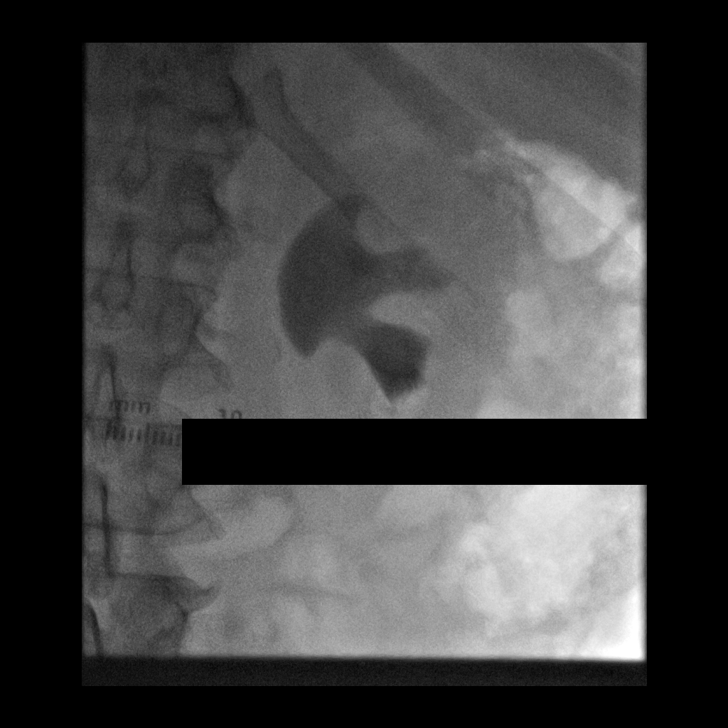
[im 6/6]
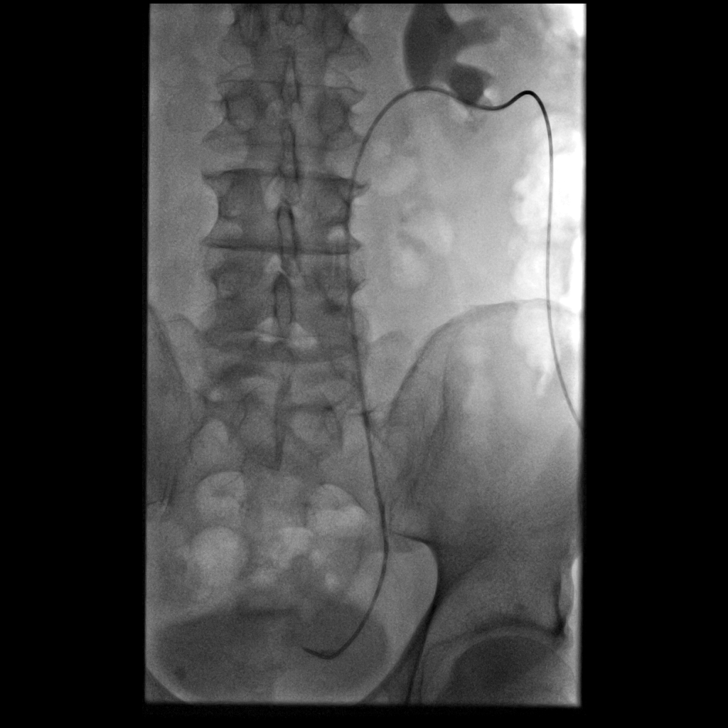

[7 of 7 positions shown; findings below may reference images not displayed]

ANESTHESIA/SEDATION:
2.0 mg IV Versed; 50 mcg IV Fentanyl.

Total Moderate Sedation Time

26 minutes

CONTRAST:  15ml Omnipaque 300

MEDICATIONS:
2 g IV Ancef. As antibiotic prophylaxis, Ancef was ordered
pre-procedure and administered intravenously within one hour of
incision.

FLUOROSCOPY TIME:  6 minutes and 24 seconds

PROCEDURE:
The procedure, risks, benefits, and alternatives were explained to
the patient. Questions regarding the procedure were encouraged and
answered. The patient understands and consents to the procedure.

The right flank region was prepped with Betadine in a sterile
fashion, and a sterile drape was applied covering the operative
field. A sterile gown and sterile gloves were used for the
procedure. Local anesthesia was provided with 1% Lidocaine.

Under direct fluoroscopic guidance, a 21 gauge needle was advanced
into the renal collecting system. Contrast injection was performed.
A transitional dilator was advanced over a guidewire. A 5 French
catheter was advanced into the renal collecting system. Over a
guidewire, this catheter was further advanced into the ureter and
bladder. The catheter was capped and secured at the skin with a silk
retention suture.

COMPLICATIONS:
None.
FINDINGS: Initial fluoroscopy demonstrates a large right staghorn calculus
occupying the entire central collecting system and extending into
interpolar and lower pole calices. Upper pole component extends into
an infundibulum. After lower pole access, a 5 French catheter was
able to be negotiated around the staghorn calculus and into the
ureter. The catheter was further advanced into the bladder lumen.
IMPRESSION: Right-sided ureteral catheter placement after percutaneous access of
the lower pole collecting system. The catheter access was
successfully negotiated around a large staghorn calculus. This
percutaneous access will be utilized for operative percutaneous
nephrolithotomy to follow.

## 2017-07-06 DIAGNOSIS — Z961 Presence of intraocular lens: Secondary | ICD-10-CM | POA: Diagnosis not present

## 2017-07-06 DIAGNOSIS — H353130 Nonexudative age-related macular degeneration, bilateral, stage unspecified: Secondary | ICD-10-CM | POA: Diagnosis not present

## 2017-07-06 DIAGNOSIS — H524 Presbyopia: Secondary | ICD-10-CM | POA: Diagnosis not present

## 2017-07-06 DIAGNOSIS — H1851 Endothelial corneal dystrophy: Secondary | ICD-10-CM | POA: Diagnosis not present

## 2017-08-05 DIAGNOSIS — I10 Essential (primary) hypertension: Secondary | ICD-10-CM | POA: Diagnosis not present

## 2017-11-22 DIAGNOSIS — J209 Acute bronchitis, unspecified: Secondary | ICD-10-CM | POA: Diagnosis not present

## 2017-11-22 DIAGNOSIS — J441 Chronic obstructive pulmonary disease with (acute) exacerbation: Secondary | ICD-10-CM | POA: Diagnosis not present

## 2017-12-24 DIAGNOSIS — J449 Chronic obstructive pulmonary disease, unspecified: Secondary | ICD-10-CM | POA: Diagnosis not present

## 2018-01-04 DIAGNOSIS — H353130 Nonexudative age-related macular degeneration, bilateral, stage unspecified: Secondary | ICD-10-CM | POA: Diagnosis not present

## 2018-01-04 DIAGNOSIS — Z961 Presence of intraocular lens: Secondary | ICD-10-CM | POA: Diagnosis not present

## 2018-01-04 DIAGNOSIS — H524 Presbyopia: Secondary | ICD-10-CM | POA: Diagnosis not present

## 2018-01-04 DIAGNOSIS — H1851 Endothelial corneal dystrophy: Secondary | ICD-10-CM | POA: Diagnosis not present

## 2018-01-05 DIAGNOSIS — H43393 Other vitreous opacities, bilateral: Secondary | ICD-10-CM | POA: Diagnosis not present

## 2018-01-05 DIAGNOSIS — H353132 Nonexudative age-related macular degeneration, bilateral, intermediate dry stage: Secondary | ICD-10-CM | POA: Diagnosis not present

## 2018-01-18 DIAGNOSIS — J449 Chronic obstructive pulmonary disease, unspecified: Secondary | ICD-10-CM | POA: Diagnosis not present

## 2018-01-18 DIAGNOSIS — J301 Allergic rhinitis due to pollen: Secondary | ICD-10-CM | POA: Diagnosis not present

## 2018-02-04 DIAGNOSIS — J301 Allergic rhinitis due to pollen: Secondary | ICD-10-CM | POA: Diagnosis not present

## 2018-02-04 DIAGNOSIS — J449 Chronic obstructive pulmonary disease, unspecified: Secondary | ICD-10-CM | POA: Diagnosis not present

## 2018-03-11 DIAGNOSIS — J301 Allergic rhinitis due to pollen: Secondary | ICD-10-CM | POA: Diagnosis not present

## 2018-03-11 DIAGNOSIS — J449 Chronic obstructive pulmonary disease, unspecified: Secondary | ICD-10-CM | POA: Diagnosis not present

## 2018-07-13 DIAGNOSIS — J449 Chronic obstructive pulmonary disease, unspecified: Secondary | ICD-10-CM | POA: Diagnosis not present

## 2018-07-13 DIAGNOSIS — I10 Essential (primary) hypertension: Secondary | ICD-10-CM | POA: Diagnosis not present

## 2018-07-13 DIAGNOSIS — K420 Umbilical hernia with obstruction, without gangrene: Secondary | ICD-10-CM | POA: Diagnosis not present

## 2018-07-13 DIAGNOSIS — R1033 Periumbilical pain: Secondary | ICD-10-CM | POA: Diagnosis not present

## 2018-07-15 DIAGNOSIS — J449 Chronic obstructive pulmonary disease, unspecified: Secondary | ICD-10-CM | POA: Diagnosis not present

## 2018-07-15 DIAGNOSIS — J301 Allergic rhinitis due to pollen: Secondary | ICD-10-CM | POA: Diagnosis not present

## 2018-07-18 DIAGNOSIS — Z01812 Encounter for preprocedural laboratory examination: Secondary | ICD-10-CM | POA: Diagnosis not present

## 2018-07-18 DIAGNOSIS — Z01810 Encounter for preprocedural cardiovascular examination: Secondary | ICD-10-CM | POA: Diagnosis not present

## 2018-07-18 DIAGNOSIS — R001 Bradycardia, unspecified: Secondary | ICD-10-CM | POA: Diagnosis not present

## 2018-07-18 DIAGNOSIS — K420 Umbilical hernia with obstruction, without gangrene: Secondary | ICD-10-CM | POA: Diagnosis not present

## 2018-07-20 DIAGNOSIS — J449 Chronic obstructive pulmonary disease, unspecified: Secondary | ICD-10-CM | POA: Diagnosis not present

## 2018-07-20 DIAGNOSIS — K420 Umbilical hernia with obstruction, without gangrene: Secondary | ICD-10-CM | POA: Diagnosis not present

## 2018-07-20 DIAGNOSIS — I10 Essential (primary) hypertension: Secondary | ICD-10-CM | POA: Diagnosis not present

## 2018-10-14 DIAGNOSIS — J449 Chronic obstructive pulmonary disease, unspecified: Secondary | ICD-10-CM | POA: Diagnosis not present

## 2018-10-19 DIAGNOSIS — J209 Acute bronchitis, unspecified: Secondary | ICD-10-CM | POA: Diagnosis not present

## 2018-10-19 DIAGNOSIS — J441 Chronic obstructive pulmonary disease with (acute) exacerbation: Secondary | ICD-10-CM | POA: Diagnosis not present

## 2019-04-24 ENCOUNTER — Other Ambulatory Visit: Payer: Self-pay

## 2019-08-28 DIAGNOSIS — E7800 Pure hypercholesterolemia, unspecified: Secondary | ICD-10-CM | POA: Diagnosis not present

## 2019-08-28 DIAGNOSIS — N400 Enlarged prostate without lower urinary tract symptoms: Secondary | ICD-10-CM | POA: Diagnosis not present

## 2019-08-28 DIAGNOSIS — Z1389 Encounter for screening for other disorder: Secondary | ICD-10-CM | POA: Diagnosis not present

## 2019-08-28 DIAGNOSIS — I10 Essential (primary) hypertension: Secondary | ICD-10-CM | POA: Diagnosis not present

## 2019-08-28 DIAGNOSIS — J449 Chronic obstructive pulmonary disease, unspecified: Secondary | ICD-10-CM | POA: Diagnosis not present

## 2019-08-28 DIAGNOSIS — Z6824 Body mass index (BMI) 24.0-24.9, adult: Secondary | ICD-10-CM | POA: Diagnosis not present

## 2019-08-28 DIAGNOSIS — Z87442 Personal history of urinary calculi: Secondary | ICD-10-CM | POA: Diagnosis not present

## 2019-08-29 DIAGNOSIS — Z23 Encounter for immunization: Secondary | ICD-10-CM | POA: Diagnosis not present

## 2019-11-29 DIAGNOSIS — I10 Essential (primary) hypertension: Secondary | ICD-10-CM | POA: Diagnosis not present

## 2020-02-29 DIAGNOSIS — I10 Essential (primary) hypertension: Secondary | ICD-10-CM | POA: Diagnosis not present

## 2020-03-04 DIAGNOSIS — H43811 Vitreous degeneration, right eye: Secondary | ICD-10-CM | POA: Diagnosis not present

## 2020-03-04 DIAGNOSIS — H353132 Nonexudative age-related macular degeneration, bilateral, intermediate dry stage: Secondary | ICD-10-CM | POA: Diagnosis not present

## 2020-03-04 DIAGNOSIS — H43392 Other vitreous opacities, left eye: Secondary | ICD-10-CM | POA: Diagnosis not present

## 2020-07-04 DIAGNOSIS — H353130 Nonexudative age-related macular degeneration, bilateral, stage unspecified: Secondary | ICD-10-CM | POA: Diagnosis not present

## 2020-07-04 DIAGNOSIS — H04123 Dry eye syndrome of bilateral lacrimal glands: Secondary | ICD-10-CM | POA: Diagnosis not present

## 2020-07-04 DIAGNOSIS — Z961 Presence of intraocular lens: Secondary | ICD-10-CM | POA: Diagnosis not present

## 2020-07-04 DIAGNOSIS — H43393 Other vitreous opacities, bilateral: Secondary | ICD-10-CM | POA: Diagnosis not present

## 2020-11-14 DIAGNOSIS — Z23 Encounter for immunization: Secondary | ICD-10-CM | POA: Diagnosis not present

## 2021-01-06 DIAGNOSIS — Z961 Presence of intraocular lens: Secondary | ICD-10-CM | POA: Diagnosis not present

## 2021-01-06 DIAGNOSIS — H524 Presbyopia: Secondary | ICD-10-CM | POA: Diagnosis not present

## 2021-01-06 DIAGNOSIS — H353130 Nonexudative age-related macular degeneration, bilateral, stage unspecified: Secondary | ICD-10-CM | POA: Diagnosis not present

## 2021-01-06 DIAGNOSIS — H43393 Other vitreous opacities, bilateral: Secondary | ICD-10-CM | POA: Diagnosis not present

## 2021-01-06 DIAGNOSIS — H04123 Dry eye syndrome of bilateral lacrimal glands: Secondary | ICD-10-CM | POA: Diagnosis not present

## 2021-03-20 DIAGNOSIS — I10 Essential (primary) hypertension: Secondary | ICD-10-CM | POA: Diagnosis not present

## 2021-03-20 DIAGNOSIS — E7800 Pure hypercholesterolemia, unspecified: Secondary | ICD-10-CM | POA: Diagnosis not present

## 2021-03-20 DIAGNOSIS — N400 Enlarged prostate without lower urinary tract symptoms: Secondary | ICD-10-CM | POA: Diagnosis not present

## 2021-03-20 DIAGNOSIS — Z23 Encounter for immunization: Secondary | ICD-10-CM | POA: Diagnosis not present

## 2021-03-20 DIAGNOSIS — Z0000 Encounter for general adult medical examination without abnormal findings: Secondary | ICD-10-CM | POA: Diagnosis not present

## 2021-03-20 DIAGNOSIS — J449 Chronic obstructive pulmonary disease, unspecified: Secondary | ICD-10-CM | POA: Diagnosis not present

## 2021-03-20 DIAGNOSIS — Z6823 Body mass index (BMI) 23.0-23.9, adult: Secondary | ICD-10-CM | POA: Diagnosis not present

## 2021-03-20 DIAGNOSIS — Z1331 Encounter for screening for depression: Secondary | ICD-10-CM | POA: Diagnosis not present

## 2021-04-21 DIAGNOSIS — Z1211 Encounter for screening for malignant neoplasm of colon: Secondary | ICD-10-CM | POA: Diagnosis not present

## 2021-06-23 DIAGNOSIS — E7800 Pure hypercholesterolemia, unspecified: Secondary | ICD-10-CM | POA: Diagnosis not present

## 2021-07-02 DIAGNOSIS — H43392 Other vitreous opacities, left eye: Secondary | ICD-10-CM | POA: Diagnosis not present

## 2021-07-02 DIAGNOSIS — H353132 Nonexudative age-related macular degeneration, bilateral, intermediate dry stage: Secondary | ICD-10-CM | POA: Diagnosis not present

## 2021-07-02 DIAGNOSIS — H43811 Vitreous degeneration, right eye: Secondary | ICD-10-CM | POA: Diagnosis not present

## 2022-03-30 DIAGNOSIS — E7800 Pure hypercholesterolemia, unspecified: Secondary | ICD-10-CM | POA: Diagnosis not present

## 2022-03-30 DIAGNOSIS — Z0000 Encounter for general adult medical examination without abnormal findings: Secondary | ICD-10-CM | POA: Diagnosis not present

## 2022-03-30 DIAGNOSIS — Z1331 Encounter for screening for depression: Secondary | ICD-10-CM | POA: Diagnosis not present

## 2022-03-30 DIAGNOSIS — Z6823 Body mass index (BMI) 23.0-23.9, adult: Secondary | ICD-10-CM | POA: Diagnosis not present

## 2022-03-30 DIAGNOSIS — J449 Chronic obstructive pulmonary disease, unspecified: Secondary | ICD-10-CM | POA: Diagnosis not present

## 2022-03-30 DIAGNOSIS — I10 Essential (primary) hypertension: Secondary | ICD-10-CM | POA: Diagnosis not present

## 2022-07-06 DIAGNOSIS — H353132 Nonexudative age-related macular degeneration, bilateral, intermediate dry stage: Secondary | ICD-10-CM | POA: Diagnosis not present

## 2022-07-06 DIAGNOSIS — D3132 Benign neoplasm of left choroid: Secondary | ICD-10-CM | POA: Diagnosis not present

## 2022-07-06 DIAGNOSIS — H43392 Other vitreous opacities, left eye: Secondary | ICD-10-CM | POA: Diagnosis not present

## 2022-07-06 DIAGNOSIS — H43811 Vitreous degeneration, right eye: Secondary | ICD-10-CM | POA: Diagnosis not present

## 2022-12-14 DIAGNOSIS — J449 Chronic obstructive pulmonary disease, unspecified: Secondary | ICD-10-CM | POA: Diagnosis not present

## 2022-12-14 DIAGNOSIS — J301 Allergic rhinitis due to pollen: Secondary | ICD-10-CM | POA: Diagnosis not present

## 2022-12-28 DIAGNOSIS — J301 Allergic rhinitis due to pollen: Secondary | ICD-10-CM | POA: Diagnosis not present

## 2022-12-28 DIAGNOSIS — Z87891 Personal history of nicotine dependence: Secondary | ICD-10-CM | POA: Diagnosis not present

## 2022-12-28 DIAGNOSIS — J449 Chronic obstructive pulmonary disease, unspecified: Secondary | ICD-10-CM | POA: Diagnosis not present

## 2022-12-31 DIAGNOSIS — Z87891 Personal history of nicotine dependence: Secondary | ICD-10-CM | POA: Diagnosis not present

## 2022-12-31 DIAGNOSIS — Z122 Encounter for screening for malignant neoplasm of respiratory organs: Secondary | ICD-10-CM | POA: Diagnosis not present

## 2023-01-13 DIAGNOSIS — J301 Allergic rhinitis due to pollen: Secondary | ICD-10-CM | POA: Diagnosis not present

## 2023-01-13 DIAGNOSIS — I2510 Atherosclerotic heart disease of native coronary artery without angina pectoris: Secondary | ICD-10-CM | POA: Diagnosis not present

## 2023-01-13 DIAGNOSIS — Z87891 Personal history of nicotine dependence: Secondary | ICD-10-CM | POA: Diagnosis not present

## 2023-01-13 DIAGNOSIS — J449 Chronic obstructive pulmonary disease, unspecified: Secondary | ICD-10-CM | POA: Diagnosis not present

## 2023-02-03 DIAGNOSIS — I2510 Atherosclerotic heart disease of native coronary artery without angina pectoris: Secondary | ICD-10-CM | POA: Diagnosis not present

## 2023-02-03 DIAGNOSIS — J449 Chronic obstructive pulmonary disease, unspecified: Secondary | ICD-10-CM | POA: Diagnosis not present

## 2023-02-10 DIAGNOSIS — J449 Chronic obstructive pulmonary disease, unspecified: Secondary | ICD-10-CM | POA: Diagnosis not present

## 2023-02-10 DIAGNOSIS — Z87891 Personal history of nicotine dependence: Secondary | ICD-10-CM | POA: Diagnosis not present

## 2023-02-10 DIAGNOSIS — J301 Allergic rhinitis due to pollen: Secondary | ICD-10-CM | POA: Diagnosis not present

## 2023-02-10 DIAGNOSIS — I2510 Atherosclerotic heart disease of native coronary artery without angina pectoris: Secondary | ICD-10-CM | POA: Diagnosis not present

## 2023-03-03 ENCOUNTER — Other Ambulatory Visit: Payer: Self-pay

## 2023-03-03 DIAGNOSIS — Z87442 Personal history of urinary calculi: Secondary | ICD-10-CM | POA: Insufficient documentation

## 2023-03-03 DIAGNOSIS — J449 Chronic obstructive pulmonary disease, unspecified: Secondary | ICD-10-CM | POA: Insufficient documentation

## 2023-03-03 DIAGNOSIS — H40009 Preglaucoma, unspecified, unspecified eye: Secondary | ICD-10-CM | POA: Insufficient documentation

## 2023-03-03 DIAGNOSIS — K219 Gastro-esophageal reflux disease without esophagitis: Secondary | ICD-10-CM | POA: Insufficient documentation

## 2023-03-03 DIAGNOSIS — R3915 Urgency of urination: Secondary | ICD-10-CM | POA: Insufficient documentation

## 2023-03-03 DIAGNOSIS — Z973 Presence of spectacles and contact lenses: Secondary | ICD-10-CM | POA: Insufficient documentation

## 2023-03-03 DIAGNOSIS — I10 Essential (primary) hypertension: Secondary | ICD-10-CM | POA: Insufficient documentation

## 2023-03-03 DIAGNOSIS — E785 Hyperlipidemia, unspecified: Secondary | ICD-10-CM | POA: Insufficient documentation

## 2023-03-07 DIAGNOSIS — Z23 Encounter for immunization: Secondary | ICD-10-CM | POA: Diagnosis not present

## 2023-04-06 ENCOUNTER — Encounter: Payer: Self-pay | Admitting: Cardiology

## 2023-04-06 ENCOUNTER — Ambulatory Visit: Payer: Medicare Other | Attending: Cardiology | Admitting: Cardiology

## 2023-04-06 VITALS — BP 120/74 | HR 63 | Ht 71.0 in | Wt 156.6 lb

## 2023-04-06 DIAGNOSIS — I340 Nonrheumatic mitral (valve) insufficiency: Secondary | ICD-10-CM | POA: Diagnosis not present

## 2023-04-06 DIAGNOSIS — I10 Essential (primary) hypertension: Secondary | ICD-10-CM | POA: Diagnosis not present

## 2023-04-06 DIAGNOSIS — I2510 Atherosclerotic heart disease of native coronary artery without angina pectoris: Secondary | ICD-10-CM | POA: Diagnosis not present

## 2023-04-06 DIAGNOSIS — I2584 Coronary atherosclerosis due to calcified coronary lesion: Secondary | ICD-10-CM | POA: Diagnosis not present

## 2023-04-06 DIAGNOSIS — R0609 Other forms of dyspnea: Secondary | ICD-10-CM | POA: Diagnosis not present

## 2023-04-06 DIAGNOSIS — E782 Mixed hyperlipidemia: Secondary | ICD-10-CM | POA: Diagnosis not present

## 2023-04-06 DIAGNOSIS — J431 Panlobular emphysema: Secondary | ICD-10-CM | POA: Insufficient documentation

## 2023-04-06 MED ORDER — ASPIRIN 81 MG PO TBEC: 81.0000 mg | DELAYED_RELEASE_TABLET | Freq: Every day | ORAL | Status: AC

## 2023-04-06 MED ORDER — NITROGLYCERIN 0.4 MG SL SUBL: 0.4000 mg | SUBLINGUAL_TABLET | SUBLINGUAL | 6 refills | Status: AC | PRN

## 2023-04-06 NOTE — Patient Instructions (Signed)
Medication Instructions:  Your physician has recommended you make the following change in your medication:   Start taking 81 mg coated aspirin daily.  Use nitroglycerin 1 tablet placed under the tongue at the first sign of chest pain or an angina attack. 1 tablet may be used every 5 minutes as needed, for up to 15 minutes. Do not take more than 3 tablets in 15 minutes. If pain persist call 911 or go to the nearest ED.   *If you need a refill on your cardiac medications before your next appointment, please call your pharmacy*   Lab Work: None ordered If you have labs (blood work) drawn today and your tests are completely normal, you will receive your results only by: MyChart Message (if you have MyChart) OR A paper copy in the mail If you have any lab test that is abnormal or we need to change your treatment, we will call you to review the results.   Testing/Procedures: You are scheduled for a Myocardial Perfusion Imaging Study.  Please arrive 15 minutes prior to your appointment time for registration and insurance purposes.  The test will take approximately 3 to 4 hours to complete; you may bring reading material.  If someone comes with you to your appointment, they will need to remain in the main lobby due to limited space in the testing area.   How to prepare for your Myocardial Perfusion Test: Do not eat or drink 3 hours prior to your test, except you may have water. Do not consume products containing caffeine (regular or decaffeinated) 12 hours prior to your test. (ex: coffee, chocolate, sodas, tea). Do bring a list of your current medications with you.  If not listed below, you may take your medications as normal. Do wear comfortable clothes (no dresses or overalls) and walking shoes, tennis shoes preferred (No heels or open toe shoes are allowed). Do NOT wear cologne, perfume, aftershave, or lotions (deodorant is allowed). If these instructions are not followed, your test will  have to be rescheduled.  If you cannot keep your appointment, please provide 24 hours notification to the [facility], to avoid a possible $50 charge to your account.   Your physician has requested that you have an echocardiogram. Echocardiography is a painless test that uses sound waves to create images of your heart. It provides your doctor with information about the size and shape of your heart and how well your heart's chambers and valves are working. This procedure takes approximately one hour. There are no restrictions for this procedure. Please do NOT wear cologne, perfume, aftershave, or lotions (deodorant is allowed). Please arrive 15 minutes prior to your appointment time.   Follow-Up: At [facility], you and your health needs are our priority.  As part of our continuing mission to provide you with exceptional heart care, we have created designated Provider Care Teams.  These Care Teams include your primary Cardiologist (physician) and Advanced Practice Providers (APPs -  Physician Assistants and Nurse Practitioners) who all work together to provide you with the care you need, when you need it.  We recommend signing up for the patient portal called "MyChart".  Sign up information is provided on this After Visit Summary.  MyChart is used to connect with patients for Virtual Visits (Telemedicine).  Patients are able to view lab/test results, encounter notes, upcoming appointments, etc.  Non-urgent messages can be sent to your provider as well.   To learn more about what you can do with MyChart,  go to [email or web address].    Your next appointment:   9 month(s)  Provider:   Belva Crome, MD   Other Instructions  Cardiac Nuclear Scan A cardiac nuclear scan is a test that is done to check the flow of blood to your heart. It is done when you are resting and when you are exercising. The test looks for problems such as: Not enough blood reaching a portion of the heart. The  heart muscle not working as it should. You may need this test if you have: Heart disease. Lab results that are not normal. Had heart surgery or a balloon procedure to open up blocked arteries (angioplasty) or a small mesh tube (stent). Chest pain. Shortness of breath. Had a heart attack. In this test, a special dye (tracer) is put into your bloodstream. The tracer will travel to your heart. A camera will then take pictures of your heart to see how the tracer moves through your heart. This test is usually done at a hospital and takes 2-4 hours. Tell a doctor about: Any allergies you have. All medicines you are taking, including vitamins, herbs, eye drops, creams, and over-the-counter medicines. Any bleeding problems you have. Any surgeries you have had. Any medical conditions you have. Whether you are pregnant or may be pregnant. Any history of asthma or long-term (chronic) lung disease. Any history of heart rhythm disorders or heart valve conditions. What are the risks? Your doctor will talk with you about risks. These may include: Serious chest pain and heart attack. This is only a risk if the stress portion of the test is done. Fast or uneven heartbeats (palpitations). A feeling of warmth in your chest. This feeling usually does not last long. Allergic reaction to the tracer. Shortness of breath or trouble breathing. What happens before the test? Ask your doctor about changing or stopping your normal medicines. Follow instructions from your doctor about what you cannot eat or drink. Remove your jewelry on the day of the test. Ask your doctor if you need to avoid nicotine or caffeine. What happens during the test? An IV tube will be inserted into one of your veins. Your doctor will give you a small amount of tracer through the IV tube. You will wait for 20-40 minutes while the tracer moves through your bloodstream. Your heart will be monitored with an electrocardiogram (ECG). You  will lie down on an exam table. Pictures of your heart will be taken for about 15-20 minutes. You may also have a stress test. For this test, one of these things may be done: You will be asked to exercise on a treadmill or a stationary bike. You will be given medicines that will make your heart work harder. This is done if you are unable to exercise. When blood flow to your heart has peaked, a tracer will again be given through the IV tube. After 20-40 minutes, you will get back on the exam table. More pictures will be taken of your heart. Depending on the tracer that is used, more pictures may need to be taken 3-4 hours later. Your IV tube will be removed when the test is over. The test may vary among doctors and hospitals. What happens after the test? Ask your doctor: Whether you can return to your normal schedule, including diet, activities, travel, and medicines. Whether you should drink more fluids. This will help to remove the tracer from your body. Ask your doctor, or the department that is doing the test: When will  my results be ready? How will I get my results? What are my treatment options? What other tests do I need? What are my next steps? This information is not intended to replace advice given to you by your health care provider. Make sure you discuss any questions you have with your health care provider. Document Revised: 02/03/2022 Document Reviewed: 02/03/2022 Elsevier Patient Education  2023 [employer]  Echocardiogram An echocardiogram is a test that uses sound waves (ultrasound) to produce images of the heart. Images from an echocardiogram can provide important information about: Heart size and shape. The size and thickness and movement of your heart's walls. Heart muscle function and strength. Heart valve function or if you have stenosis. Stenosis is when the heart valves are too narrow. If blood is flowing backward through the heart valves (regurgitation). A  tumor or infectious growth around the heart valves. Areas of heart muscle that are not working well because of poor blood flow or injury from a heart attack. Aneurysm detection. An aneurysm is a weak or damaged part of an artery wall. The wall bulges out from the normal force of blood pumping through the body. Tell a health care provider about: Any allergies you have. All medicines you are taking, including vitamins, herbs, eye drops, creams, and over-the-counter medicines. Any blood disorders you have. Any surgeries you have had. Any medical conditions you have. Whether you are pregnant or may be pregnant. What are the risks? Generally, this is a safe test. However, problems may occur, including an allergic reaction to dye (contrast) that may be used during the test. What happens before the test? No specific preparation is needed. You may eat and drink normally. What happens during the test?  You will take off your clothes from the waist up and put on a hospital gown. Electrodes or electrocardiogram (ECG)patches may be placed on your chest. The electrodes or patches are then connected to a device that monitors your heart rate and rhythm. You will lie down on a table for an ultrasound exam. A gel will be applied to your chest to help sound waves pass through your skin. A handheld device, called a transducer, will be pressed against your chest and moved over your heart. The transducer produces sound waves that travel to your heart and bounce back (or "echo" back) to the transducer. These sound waves will be captured in real-time and changed into images of your heart that can be viewed on a video monitor. The images will be recorded on a computer and reviewed by your health care provider. You may be asked to change positions or hold your breath for a short time. This makes it easier to get different views or better views of your heart. In some cases, you may receive contrast through an IV in one  of your veins. This can improve the quality of the pictures from your heart. The procedure may vary among health care providers and hospitals. What can I expect after the test? You may return to your normal, everyday life, including diet, activities, and medicines, unless your health care provider tells you not to do that. Follow these instructions at home: It is up to you to get the results of your test. Ask your health care provider, or the department that is doing the test, when your results will be ready. Keep all follow-up visits. This is important. Summary An echocardiogram is a test that uses sound waves (ultrasound) to produce images of the heart. Images from an  echocardiogram can provide important information about the size and shape of your heart, heart muscle function, heart valve function, and other possible heart problems. You do not need to do anything to prepare before this test. You may eat and drink normally. After the echocardiogram is completed, you may return to your normal, everyday life, unless your health care provider tells you not to do that. This information is not intended to replace advice given to you by your health care provider. Make sure you discuss any questions you have with your health care provider. Document Revised: 05/21/2021 Document Reviewed: 04/30/2020 Elsevier Patient Education  2023 [employer].

## 2023-04-06 NOTE — Progress Notes (Signed)
Cardiology Office Note:    Date:  04/06/2023   ID:  Joel Jenkins, DOB 07/29/1951, MRN [id number]  PCP:  Crist Fat, MD  Cardiologist:  Garwin Brothers, MD   Referring MD: Marcellus Scott, MD    ASSESSMENT:    1. Hypertension, unspecified type   2. Coronary artery calcification   3. Hyperlipidemia, mixed   4. Dyspnea on exertion   5. Panlobular emphysema (HCC)   6. Moderate mitral regurgitation    PLAN:    In order of problems listed above:  Coronary artery calcification and dyspnea on exertion: Secondary prevention stressed with the patient.  Importance of compliance with diet medication stressed any vocalized understanding.  He was advised to undergo Lexiscan sestamibi to assess for any ischemic substrate as a cause of his symptoms and he is agreeable.  He was advised to take a coated baby aspirin on a daily basis.  Sublingual nitroglycerin prescription was sent, its protocol and 911 protocol explained and the patient vocalized understanding questions were answered to the patient's satisfaction Cardiac murmur: Echocardiogram will be done to assess murmur heard on auscultation. Mixed dyslipidemia: Markedly elevated lipids.  He will have complete blood work with primary care next week and send me a copy.  I will intervene accordingly.  His goal LDL must be less than 60 and he is aware of it already. COPD: Followed by primary care.  He uses oxygen as needed.  Especially when he exerts himself. Essential hypertension: Blood pressure stable and diet was emphasized. Patient will be seen in follow-up appointment in 6 months or earlier if the patient has any concerns.    Medication Adjustments/Labs and Tests Ordered: Current medicines are reviewed at length with the patient today.  Concerns regarding medicines are outlined above.  Orders Placed This Encounter  Procedures   MYOCARDIAL PERFUSION IMAGING   EKG 12-Lead   ECHOCARDIOGRAM COMPLETE   Meds ordered this encounter   Medications   nitroGLYCERIN (NITROSTAT) 0.4 MG SL tablet    Sig: Place 1 tablet (0.4 mg total) under the tongue every 5 (five) minutes as needed.    Dispense:  25 tablet    Refill:  6   aspirin EC 81 MG tablet    Sig: Take 1 tablet (81 mg total) by mouth daily. Swallow whole.     History of Present Illness:    Joel Jenkins is a 72 y.o. male who is being seen today for the evaluation of coronary artery calcification and dyspnea on exertion at the request of Marcellus Scott, MD. patient is a pleasant 72 year old male.  He has past medical history of recently diagnosed coronary artery calcification on CT scan, COPD.  He has marked elevation in LDL.  He is not taking any medications for this for lipid lowering.  He mentions of dyspnea on exertion.  He is an active gentleman but does not exercise on a regular basis.  He gives history of dyspnea on exertion.  At the time of my evaluation, the patient is alert awake oriented and in no distress.  Past Medical History:  Diagnosis Date   Borderline glaucoma    COPD (chronic obstructive pulmonary disease) (HCC)    GERD (gastroesophageal reflux disease)    History of kidney stones    Hyperlipidemia    Hypertension    Nephrolithiasis 09/17/2015   Urgency of urination    Wears glasses     Past Surgical History:  Procedure Laterality Date   CYSTO/  URETEROSCOPIC STONE EXTRACTION  1980   CYSTOSCOPY WITH URETEROSCOPY AND STENT PLACEMENT Right 10/21/2015   Procedure: CYSTOSCOPY RIGHT URETEROSCOPY RIGHT STENT EXCHANGE;  Surgeon: Hildred Laser, MD;  Location: [facility];  Service: Urology;  Laterality: Right;   HOLMIUM LASER APPLICATION Right 10/21/2015   Procedure: HOLMIUM LASER APPLICATION;  Surgeon: Hildred Laser, MD;  Location: [facility];  Service: Urology;  Laterality: Right;   NEPHROLITHOTOMY Right 09/17/2015   Procedure: RIGHT  PERCUTANEOUS NEPHROLITHOTOMY ;  Surgeon: Hildred Laser, MD;   Location: [facility];  Service: Urology;  Laterality: Right;    Current Medications: Current Meds  Medication Sig   aspirin EC 81 MG tablet Take 1 tablet (81 mg total) by mouth daily. Swallow whole.   lisinopril (PRINIVIL,ZESTRIL) 40 MG tablet Take 40 mg by mouth every morning.    nitroGLYCERIN (NITROSTAT) 0.4 MG SL tablet Place 1 tablet (0.4 mg total) under the tongue every 5 (five) minutes as needed.   omeprazole (PRILOSEC) 20 MG capsule Take 20 mg by mouth daily.   TRELEGY ELLIPTA 100-62.5-25 MCG/ACT AEPB Inhale 1 puff into the lungs daily.   VENTOLIN HFA 108 (90 Base) MCG/ACT inhaler Inhale 1-2 puffs into the lungs as needed for wheezing or shortness of breath.     Allergies:   Patient has no known allergies.   Social History   Socioeconomic History   Marital status: Married    Spouse name: Not on file   Number of children: Not on file   Years of education: Not on file   Highest education level: Not on file  Occupational History   Not on file  Tobacco Use   Smoking status: Former    Current packs/day: 0.00    Average packs/day: 2.0 packs/day for 30.0 years (60.0 ttl pk-yrs)    Types: Cigarettes    Start date: 09/12/1979    Quit date: 09/11/2009    Years since quitting: 13.5   Smokeless tobacco: Current    Types: Snuff   Tobacco comments:    OCCASIONAL DIP TOBACCO  Substance and Sexual Activity   Alcohol use: No   Drug use: No   Sexual activity: Not on file  Other Topics Concern   Not on file  Social History Narrative   Not on file   Social Determinants of Health   Financial Resource Strain: Not on file  Food Insecurity: Not on file  Transportation Needs: Not on file  Physical Activity: Not on file  Stress: Not on file  Social Connections: Not on file     Family History: The patient's family history includes Heart failure in his mother.  ROS:   Please see the history of present illness.    All other systems reviewed and are negative.  EKGs/Labs/Other  Studies Reviewed:    The following studies were reviewed today: Sinus rhythm rightward axis and nonspecific ST-T changes.       Recent Labs: No results found for requested labs within last 365 days.  Recent Lipid Panel No results found for: "CHOL", "TRIG", "HDL", "CHOLHDL", "VLDL", "LDLCALC", "LDLDIRECT"  Physical Exam:    VS:  BP 120/74   Pulse 63   Ht 5\' 11"  (1.803 m)   Wt 156 lb 9.6 oz (71 kg)   SpO2 94%   BMI 21.84 kg/m     Wt Readings from Last 3 Encounters:  04/06/23 156 lb 9.6 oz (71 kg)  10/21/15 165 lb (74.8 kg)  09/17/15 169 lb (76.7 kg)     GEN:  Patient is in no acute distress HEENT: Normal NECK: No JVD; No carotid bruits LYMPHATICS: No lymphadenopathy CARDIAC: S1 S2 regular, 2/6 systolic murmur at the apex. RESPIRATORY:  Clear to auscultation without rales, wheezing or rhonchi  ABDOMEN: Soft, non-tender, non-distended MUSCULOSKELETAL:  No edema; No deformity  SKIN: Warm and dry NEUROLOGIC:  Alert and oriented x 3 PSYCHIATRIC:  Normal affect    Signed, Garwin Brothers, MD  04/06/2023 3:17 PM    [facility]

## 2023-04-07 ENCOUNTER — Telehealth (HOSPITAL_COMMUNITY): Payer: Self-pay | Admitting: *Deleted

## 2023-04-07 NOTE — Telephone Encounter (Signed)
Patient given detailed instructions per Myocardial Perfusion Study Information Sheet for the test on 04/13/2023 at 7:45. Patient notified to arrive 15 minutes early and that it is imperative to arrive on time for appointment to keep from having the test rescheduled.  If you need to cancel or reschedule your appointment, please call the office within 24 hours of your appointment. . Patient verbalized understanding.[name]

## 2023-04-13 ENCOUNTER — Ambulatory Visit: Payer: Medicare Other | Attending: Cardiology

## 2023-04-13 DIAGNOSIS — I2584 Coronary atherosclerosis due to calcified coronary lesion: Secondary | ICD-10-CM | POA: Insufficient documentation

## 2023-04-13 DIAGNOSIS — R0609 Other forms of dyspnea: Secondary | ICD-10-CM | POA: Diagnosis not present

## 2023-04-13 DIAGNOSIS — I2510 Atherosclerotic heart disease of native coronary artery without angina pectoris: Secondary | ICD-10-CM | POA: Insufficient documentation

## 2023-04-13 LAB — MYOCARDIAL PERFUSION IMAGING
LV dias vol: 88 mL (ref 62–150)
LV sys vol: 34 mL
Nuc Stress EF: 62 %
Peak HR: 73 {beats}/min
Rest HR: 48 {beats}/min
Rest Nuclear Isotope Dose: 10.5 mCi
SDS: 1
SRS: 5
SSS: 3
ST Depression (mm): 0 mm
Stress Nuclear Isotope Dose: 31 mCi
TID: 1.11

## 2023-04-13 MED ORDER — TECHNETIUM TC 99M TETROFOSMIN IV KIT
31.0000 | PACK | Freq: Once | INTRAVENOUS | Status: AC | PRN
  Administered 2023-04-13: 31 via INTRAVENOUS

## 2023-04-13 MED ORDER — REGADENOSON 0.4 MG/5ML IV SOLN
0.4000 mg | Freq: Once | INTRAVENOUS | Status: AC
  Administered 2023-04-13: 0.4 mg via INTRAVENOUS

## 2023-04-13 MED ORDER — TECHNETIUM TC 99M TETROFOSMIN IV KIT
10.5000 | PACK | Freq: Once | INTRAVENOUS | Status: AC | PRN
  Administered 2023-04-13: 10.5 via INTRAVENOUS

## 2023-04-14 ENCOUNTER — Encounter: Payer: Self-pay | Admitting: Internal Medicine

## 2023-04-14 ENCOUNTER — Ambulatory Visit: Payer: Medicare Other | Admitting: Internal Medicine

## 2023-04-14 VITALS — BP 102/70 | HR 72 | Temp 98.3°F | Resp 16 | Ht 71.0 in | Wt 154.6 lb

## 2023-04-14 DIAGNOSIS — H3530 Unspecified macular degeneration: Secondary | ICD-10-CM | POA: Diagnosis not present

## 2023-04-14 DIAGNOSIS — I2584 Coronary atherosclerosis due to calcified coronary lesion: Secondary | ICD-10-CM

## 2023-04-14 DIAGNOSIS — J449 Chronic obstructive pulmonary disease, unspecified: Secondary | ICD-10-CM

## 2023-04-14 DIAGNOSIS — I2510 Atherosclerotic heart disease of native coronary artery without angina pectoris: Secondary | ICD-10-CM | POA: Diagnosis not present

## 2023-04-14 DIAGNOSIS — K219 Gastro-esophageal reflux disease without esophagitis: Secondary | ICD-10-CM | POA: Diagnosis not present

## 2023-04-14 DIAGNOSIS — N400 Enlarged prostate without lower urinary tract symptoms: Secondary | ICD-10-CM

## 2023-04-14 DIAGNOSIS — Z6821 Body mass index (BMI) 21.0-21.9, adult: Secondary | ICD-10-CM | POA: Diagnosis not present

## 2023-04-14 DIAGNOSIS — Z1331 Encounter for screening for depression: Secondary | ICD-10-CM | POA: Diagnosis not present

## 2023-04-14 DIAGNOSIS — I340 Nonrheumatic mitral (valve) insufficiency: Secondary | ICD-10-CM

## 2023-04-14 DIAGNOSIS — Z0000 Encounter for general adult medical examination without abnormal findings: Secondary | ICD-10-CM | POA: Diagnosis not present

## 2023-04-14 DIAGNOSIS — I10 Essential (primary) hypertension: Secondary | ICD-10-CM | POA: Diagnosis not present

## 2023-04-14 DIAGNOSIS — E782 Mixed hyperlipidemia: Secondary | ICD-10-CM | POA: Diagnosis not present

## 2023-04-14 MED ORDER — AMLODIPINE BESYLATE 5 MG PO TABS: 5.0000 mg | ORAL_TABLET | Freq: Every day | ORAL | 1 refills | Status: DC

## 2023-04-14 MED ORDER — TRELEGY ELLIPTA 100-62.5-25 MCG/ACT IN AEPB: 1.0000 | INHALATION_SPRAY | Freq: Every day | RESPIRATORY_TRACT | 11 refills | Status: DC

## 2023-04-14 MED ORDER — LISINOPRIL 40 MG PO TABS: 40.0000 mg | ORAL_TABLET | Freq: Every morning | ORAL | 1 refills | Status: DC

## 2023-04-14 NOTE — Assessment & Plan Note (Signed)
I want him to eat healthy and exercise.

## 2023-04-14 NOTE — Progress Notes (Signed)
Preventive Screening-Counseling & Management     Joel Jenkins is a 72 y.o. male who presents for Medicare Annual/Subsequent preventive examination.  Joel Jenkins is a 72 year old Caucasian/White male who presents for his annual wellness exam. He is due for the following health maintenance studies: visual exam, colonoscopy, and screening labs. This patient's past medical history Benign Prostatic Hypertrophy, COPD, Hyperlipidemia, Hypertension, Benign Essential, Kidney Stones, and Tobacco Abuse.   His last eye exam was done 04/2022 where they are following him for macular degeneration.  His vision has slowly worsened.  He states his center vision is gone but there is no change from last year. He performed a FIT test on 04/21/2021 and on 08/30/2019 and this was negative for fecal blood. He has never had a colonoscopy and refuses to have that done. He has a history of BPH and kidney stones and he denies any recent kidney stones and no problems with urination.  He quit smoking in 2005 where he smoked about 30-40 years.  The patient does not exercise regularly. He does not get yearly flu vaccines. He had a Prevnar 13 vaccine in 09/2019 and he had the pneumovax 23 vaccine in 02/2021.  He has not had a shingles vaccine.  He has had 2 COVID-19 vaccines but no boosters. He denies any memory loss, depression or anxiety. The patient is on an ASA 81mg  daily.   Joel Jenkins returns today for routine followup on his cholesterol.  Last year, I wanted to try him on lipitor but he did not want anymore statins.  He has been tried on pravastatin, simvastatin and lovastatin which have all caused myalgias.  Overall, he states he is doing well and is without any complaints or problems at this time. He specifically denies abdominal pain, nausea, vomiting, diarrhea, myalgias, and fatigue. He remains on dietary management. He is fasting in anticipation for labs today.    The patient is a 72 year old Caucasian/White male  who presents for a follow-up evaluation of hypertension.  Since his last visit, there has been no problems.  The patient has been checking his blood pressure at home. The patient's blood pressure has ranged systollically in the 120-130's. The patient's current medications include: amlodipine 5mg  daily and lisinopril 40 mg daily. The patient has been tolerating his medications well. The patient denies any headache, visual changes, dizziness, lightheadness, chest pain, shortness of breath, weakness/numbness, and edema.  He reports there have been no other symptoms noted.    The patient is a 72 year old Caucasian/White male who presents with a moderately severe COPD of years duration and presents today for a status visit. He states he did smoke and quit in 2005. He was diagnosed with COPD by pulmonary in 2019. I saw him for his COPD 09/2018 where he had a COPD exacerbation with bronchitis. I wanted him to start anoro at that time but he could not afford it.  He is followed by Dr. Heath Gold in pulmonary who saw him a few months ago and started him on trelegy and he uses albutrol HFA as needed.  The patient states this is helping his breathing.  They also started him on oxygen 2L via nasal canula at night.  He no longer takes any nebulization treatments.  He cannot take ipratropium due to the cholingeric side effects of difficulty urinating.  See PMH for summary of pulmonary history and lab reports for status of any previous PFTs: COPD. Comorbid conditions : none. He  has the following baseline symptoms: exertional dyspnea. The exertional dyspnea is brought on by the equivalent of climbing 13 stairs and there has been no change in this in the past year. The patient's condition is controlled by medications as summarized in the medication list on the face sheet. He has no modifiable risk factors. Specifically denied complaints: worsening exertional dyspnea, increased wheezing, productive cough, change in color of sputum,  pleuritic chest pain, purulent sputum, hemoptysis, and fever.  The patient was referred to cardiology by pulmonary where he saw cardiology on 04/06/2023 so he could be evaluated for coronary artery calcification and dyspnea on exertion.   They noted recent coronary artery calcification on CT scan with COPD.  He had marked elevation in LDL.  He is not taking any medications for this for lipid lowering.  They discussed secondary prevention and they want him to undergo a lexiscan nuclear stress test and told him to start an ASA 81mg  daily.  He was noted to have a heart murmur and he had an ECHO done at pulmonary medicine that showed moderate mitral regurgitation.        Are there smokers in your home (other than you)? No  Risk Factors Current exercise habits:  as above   Dietary issues discussed: none   Depression Screen (Note: if answer to either of the following is "Yes", a more complete depression screening is indicated)   Over the past two weeks, have you felt down, depressed or hopeless? No  Over the past two weeks, have you felt little interest or pleasure in doing things? No  Have you lost interest or pleasure in daily life? No  Do you often feel hopeless? No  Do you cry easily over simple problems? No  Activities of Daily Living In your present state of health, do you have any difficulty performing the following activities?:  Driving? No Managing money?  No Feeding yourself? No Getting from bed to chair? No Climbing a flight of stairs? No Preparing food and eating?: No Bathing or showering? No Getting dressed: No Getting to the toilet? No Using the toilet:No Moving around from place to place: No In the past year have you fallen or had a near fall?:No   Are you sexually active?  No  Do you have more than one partner?  No  Hearing Difficulties: No Do you often ask people to speak up or repeat themselves? No Do you experience ringing or noises in your ears? Yes Do you have  difficulty understanding soft or whispered voices? No   Do you feel that you have a problem with memory? No  Do you often misplace items? Yes  Do you feel safe at home?  Yes  Cognitive Testing  Alert? Yes  Normal Appearance?Yes  Oriented to person? Yes  Place? Yes   Time? Yes  Recall of three objects?  Yes  Can perform simple calculations? No  Displays appropriate judgment?Yes  Can read the correct time from a watch face?Yes  Fall Risk Prevention  Any stairs in or around the home? No  If so, are there any without handrails? No  Home free of loose throw rugs in walkways, pet beds, electrical cords, etc? Yes  Adequate lighting in your home to reduce risk of falls? Yes  Use of a cane, walker or w/c? No    Time Up and Go  Was the test performed? Yes .  Length of time to ambulate 10 feet: 8 sec.   Gait steady and fast  without use of assistive device    Advanced Directives have been discussed with the patient? Yes   List the Names of Other Physician/Practitioners you currently use: Patient Care Team: Crist Fat, MD as PCP - General (Internal Medicine)    Past Medical History:  Diagnosis Date   Borderline glaucoma    BPH (benign prostatic hyperplasia)    COPD (chronic obstructive pulmonary disease) (HCC)    GERD (gastroesophageal reflux disease)    History of kidney stones    Hyperlipidemia    Hypertension    Nephrolithiasis 09/17/2015    Past Surgical History:  Procedure Laterality Date   CYSTO/  URETEROSCOPIC STONE EXTRACTION  1980   CYSTOSCOPY WITH URETEROSCOPY AND STENT PLACEMENT Right 10/21/2015   Procedure: CYSTOSCOPY RIGHT URETEROSCOPY RIGHT STENT EXCHANGE;  Surgeon: Hildred Laser, MD;  Location: [facility];  Service: Urology;  Laterality: Right;   HOLMIUM LASER APPLICATION Right 10/21/2015   Procedure: HOLMIUM LASER APPLICATION;  Surgeon: Hildred Laser, MD;  Location: [facility];  Service: Urology;  Laterality:  Right;   NEPHROLITHOTOMY Right 09/17/2015   Procedure: RIGHT  PERCUTANEOUS NEPHROLITHOTOMY ;  Surgeon: Hildred Laser, MD;  Location: [facility];  Service: Urology;  Laterality: Right;      Current Medications  Current Outpatient Medications  Medication Sig Dispense Refill   amLODipine (NORVASC) 5 MG tablet Take 5 mg by mouth daily.     aspirin EC 81 MG tablet Take 1 tablet (81 mg total) by mouth daily. Swallow whole.     lisinopril (PRINIVIL,ZESTRIL) 40 MG tablet Take 40 mg by mouth every morning.      nitroGLYCERIN (NITROSTAT) 0.4 MG SL tablet Place 1 tablet (0.4 mg total) under the tongue every 5 (five) minutes as needed. 25 tablet 6   omeprazole (PRILOSEC) 20 MG capsule Take 20 mg by mouth daily.     TRELEGY ELLIPTA 100-62.5-25 MCG/ACT AEPB Inhale 1 puff into the lungs daily.     VENTOLIN HFA 108 (90 Base) MCG/ACT inhaler Inhale 1-2 puffs into the lungs as needed for wheezing or shortness of breath.     No current facility-administered medications for this visit.    Allergies Patient has no known allergies.   Social History Social History   Tobacco Use   Smoking status: Former    Current packs/day: 0.00    Average packs/day: 2.0 packs/day for 30.0 years (60.0 ttl pk-yrs)    Types: Cigarettes    Start date: 09/12/1979    Quit date: 09/11/2009    Years since quitting: 13.5   Smokeless tobacco: Current    Types: Snuff   Tobacco comments:    OCCASIONAL DIP TOBACCO  Substance Use Topics   Alcohol use: No     Review of Systems Review of Systems  Constitutional:  Negative for chills, fever and malaise/fatigue.  Eyes:  Negative for blurred vision and double vision.  Respiratory:  Negative for cough, hemoptysis, shortness of breath and wheezing.   Cardiovascular:  Negative for chest pain, palpitations and leg swelling.  Gastrointestinal:  Negative for abdominal pain, blood in stool, constipation, diarrhea, heartburn, melena, nausea and vomiting.  Genitourinary:  Negative  for frequency and hematuria.  Musculoskeletal:  Negative for myalgias.  Skin:  Negative for itching and rash.  Neurological:  Negative for dizziness, weakness and headaches.  Endo/Heme/Allergies:  Negative for polydipsia.  Psychiatric/Behavioral:  Negative for depression. The patient is not nervous/anxious.      Physical Exam:  Body mass index is 21.56 kg/m. BP 102/70   Pulse 72   Temp 98.3 F (36.8 C)   Resp 16   Ht 5\' 11"  (1.803 m)   Wt 154 lb 9.6 oz (70.1 kg)   SpO2 95%   BMI 21.56 kg/m   Physical Exam Constitutional:      Appearance: Normal appearance. He is not ill-appearing.  HENT:     Head: Normocephalic and atraumatic.     Right Ear: Tympanic membrane, ear canal and external ear normal.     Left Ear: Tympanic membrane, ear canal and external ear normal.     Nose: Nose normal. No congestion or rhinorrhea.     Mouth/Throat:     Mouth: Mucous membranes are dry.     Pharynx: Oropharynx is clear. No oropharyngeal exudate or posterior oropharyngeal erythema.  Eyes:     General: No scleral icterus.    Conjunctiva/sclera: Conjunctivae normal.     Pupils: Pupils are equal, round, and reactive to light.  Neck:     Vascular: No carotid bruit.  Cardiovascular:     Rate and Rhythm: Normal rate and regular rhythm.     Pulses: Normal pulses.     Heart sounds: No murmur heard.    No friction rub. No gallop.  Pulmonary:     Effort: Pulmonary effort is normal. No respiratory distress.     Breath sounds: No wheezing, rhonchi or rales.  Abdominal:     General: Abdomen is flat. Bowel sounds are normal. There is no distension.     Palpations: Abdomen is soft.     Tenderness: There is no abdominal tenderness.  Musculoskeletal:     Cervical back: Neck supple. No tenderness.     Right lower leg: No edema.     Left lower leg: No edema.  Lymphadenopathy:     Cervical: No cervical adenopathy.  Skin:    General: Skin is warm and dry.     Findings: No rash.  Neurological:      General: No focal deficit present.     Mental Status: He is alert and oriented to person, place, and time.  Psychiatric:        Mood and Affect: Mood normal.        Behavior: Behavior normal.      Assessment:      Essential hypertension  Hyperlipidemia, mixed  Chronic obstructive pulmonary disease, unspecified COPD type (HCC)  Coronary artery calcification  Moderate mitral regurgitation  Gastroesophageal reflux disease, unspecified whether esophagitis present  Benign prostatic hyperplasia without lower urinary tract symptoms  Macular degeneration of both eyes, unspecified type  BMI 21.0-21.9, adult    Plan:     During the course of the visit the patient was educated and counseled about appropriate screening and preventive services including:   Pneumococcal vaccine  Colorectal cancer screening  Diet review for nutrition referral? Yes ____  Not Indicated __X__   Patient Instructions (the written plan) was given to the patient.  Moderate mitral regurgitation Currently he is not having any problems and we will monitor.  He may need a future ECHO.  Essential hypertension His BP is doing well.  We will continue on amlodipine and lisinopril.  Coronary artery calcification They want him to undergo a lexiscan.  I will check his FLP today and see what his LDL.  COPD (chronic obstructive pulmonary disease) (HCC) He seems to be doing well on trelegy.  I need his notes from Dr. Heath Gold including his CT scan  of his chest.  GERD (gastroesophageal reflux disease) Continue on his PPI.  Benign prostatic hyperplasia without lower urinary tract symptoms He is asymptomatic and we will continue to follow.  Macular degeneration of both eyes He will followup with ophthalmology as directed.  Hyperlipidemia, mixed I am going to check his FLP today with goal LDL <70 with his coronary calcifications seen on CT scan of his chest.  BMI 21.0-21.9, adult I want him to eat  healthy and exercise.   Prevention Health maintenance discussed.  We will do a FIT test on him.  He does not want colonoscopy.  I discussed a shingrix vaccine but he does not want this.  We will obtain some yearly labs.  His MMSE showed a score of 24/30.  He scored a 29/30 last year.  We reassess his memory again on his next visit.  Medicare Attestation I have personally reviewed: The patient's medical and social history Their use of alcohol, tobacco or illicit drugs Their current medications and supplements The patient's functional ability including ADLs,fall risks, home safety risks, cognitive, and hearing and visual impairment Diet and physical activities Evidence for depression or mood disorders  The patient's weight, height, and BMI have been recorded in the chart.  I have made referrals, counseling, and provided education to the patient based on review of the above and I have provided the patient with a written personalized care plan for preventive services.     Crist Fat, MD   04/14/2023

## 2023-04-14 NOTE — Assessment & Plan Note (Signed)
Currently he is not having any problems and we will monitor.  He may need a future ECHO.

## 2023-04-14 NOTE — Assessment & Plan Note (Signed)
He will followup with ophthalmology as directed.

## 2023-04-14 NOTE — Assessment & Plan Note (Signed)
Continue on his PPI.

## 2023-04-14 NOTE — Assessment & Plan Note (Signed)
He seems to be doing well on trelegy.  I need his notes from Dr. Heath Gold including his CT scan of his chest.

## 2023-04-14 NOTE — Assessment & Plan Note (Signed)
I am going to check his FLP today with goal LDL <70 with his coronary calcifications seen on CT scan of his chest.

## 2023-04-14 NOTE — Assessment & Plan Note (Signed)
He is asymptomatic and we will continue to follow.

## 2023-04-14 NOTE — Assessment & Plan Note (Signed)
They want him to undergo a lexiscan.  I will check his FLP today and see what his LDL.

## 2023-04-14 NOTE — Assessment & Plan Note (Signed)
His BP is doing well.  We will continue on amlodipine and lisinopril.

## 2023-04-15 ENCOUNTER — Ambulatory Visit: Payer: Medicare Other

## 2023-04-15 DIAGNOSIS — E782 Mixed hyperlipidemia: Secondary | ICD-10-CM

## 2023-04-15 DIAGNOSIS — Z1331 Encounter for screening for depression: Secondary | ICD-10-CM | POA: Diagnosis not present

## 2023-04-15 DIAGNOSIS — I2510 Atherosclerotic heart disease of native coronary artery without angina pectoris: Secondary | ICD-10-CM | POA: Diagnosis not present

## 2023-04-15 DIAGNOSIS — J449 Chronic obstructive pulmonary disease, unspecified: Secondary | ICD-10-CM | POA: Diagnosis not present

## 2023-04-15 DIAGNOSIS — H3530 Unspecified macular degeneration: Secondary | ICD-10-CM | POA: Diagnosis not present

## 2023-04-15 DIAGNOSIS — Z0000 Encounter for general adult medical examination without abnormal findings: Secondary | ICD-10-CM | POA: Diagnosis not present

## 2023-04-15 DIAGNOSIS — I10 Essential (primary) hypertension: Secondary | ICD-10-CM

## 2023-04-15 DIAGNOSIS — I340 Nonrheumatic mitral (valve) insufficiency: Secondary | ICD-10-CM | POA: Diagnosis not present

## 2023-04-15 DIAGNOSIS — N400 Enlarged prostate without lower urinary tract symptoms: Secondary | ICD-10-CM | POA: Diagnosis not present

## 2023-04-15 DIAGNOSIS — Z6821 Body mass index (BMI) 21.0-21.9, adult: Secondary | ICD-10-CM | POA: Diagnosis not present

## 2023-04-15 DIAGNOSIS — K219 Gastro-esophageal reflux disease without esophagitis: Secondary | ICD-10-CM | POA: Diagnosis not present

## 2023-04-15 DIAGNOSIS — I2584 Coronary atherosclerosis due to calcified coronary lesion: Secondary | ICD-10-CM | POA: Diagnosis not present

## 2023-04-16 ENCOUNTER — Ambulatory Visit: Payer: Medicare Other

## 2023-04-16 DIAGNOSIS — Z1211 Encounter for screening for malignant neoplasm of colon: Secondary | ICD-10-CM

## 2023-04-16 LAB — CMP14 + ANION GAP: AST: 13 IU/L (ref 0–40)

## 2023-04-19 ENCOUNTER — Telehealth: Payer: Self-pay

## 2023-04-19 NOTE — Telephone Encounter (Signed)
Pt notified of lab results

## 2023-04-19 NOTE — Telephone Encounter (Signed)
-----   Message from Michel Santee Eyk sent at 04/18/2023  7:00 PM EDT ----- His fecal blood test was negative.

## 2023-04-20 ENCOUNTER — Other Ambulatory Visit: Payer: Self-pay

## 2023-04-20 MED ORDER — ATORVASTATIN CALCIUM 40 MG PO TABS: 40.0000 mg | ORAL_TABLET | Freq: Every day | ORAL | 1 refills | Status: DC

## 2023-04-20 NOTE — Progress Notes (Signed)
His cholsterol is elevated. I want him to start lipitor 40mg  at bedtime and come back fasting in 3 months for a repeat visit with me.  Pt notified of lab results

## 2023-05-14 DIAGNOSIS — Z87891 Personal history of nicotine dependence: Secondary | ICD-10-CM | POA: Diagnosis not present

## 2023-05-14 DIAGNOSIS — J449 Chronic obstructive pulmonary disease, unspecified: Secondary | ICD-10-CM | POA: Diagnosis not present

## 2023-05-14 DIAGNOSIS — I2510 Atherosclerotic heart disease of native coronary artery without angina pectoris: Secondary | ICD-10-CM | POA: Diagnosis not present

## 2023-05-14 DIAGNOSIS — J301 Allergic rhinitis due to pollen: Secondary | ICD-10-CM | POA: Diagnosis not present

## 2023-05-31 DIAGNOSIS — D3132 Benign neoplasm of left choroid: Secondary | ICD-10-CM | POA: Diagnosis not present

## 2023-05-31 DIAGNOSIS — H43811 Vitreous degeneration, right eye: Secondary | ICD-10-CM | POA: Diagnosis not present

## 2023-05-31 DIAGNOSIS — H353132 Nonexudative age-related macular degeneration, bilateral, intermediate dry stage: Secondary | ICD-10-CM | POA: Diagnosis not present

## 2023-07-02 DIAGNOSIS — Z87891 Personal history of nicotine dependence: Secondary | ICD-10-CM | POA: Diagnosis not present

## 2023-07-02 DIAGNOSIS — J439 Emphysema, unspecified: Secondary | ICD-10-CM | POA: Diagnosis not present

## 2023-07-02 DIAGNOSIS — I700 Atherosclerosis of aorta: Secondary | ICD-10-CM | POA: Diagnosis not present

## 2023-07-16 ENCOUNTER — Encounter: Payer: Self-pay | Admitting: Internal Medicine

## 2023-07-16 ENCOUNTER — Ambulatory Visit: Payer: Medicare Other | Admitting: Internal Medicine

## 2023-07-16 VITALS — BP 134/82 | HR 72 | Resp 18 | Ht 71.0 in | Wt 159.6 lb

## 2023-07-16 DIAGNOSIS — R7303 Prediabetes: Secondary | ICD-10-CM | POA: Diagnosis not present

## 2023-07-16 DIAGNOSIS — R413 Other amnesia: Secondary | ICD-10-CM | POA: Insufficient documentation

## 2023-07-16 DIAGNOSIS — I2510 Atherosclerotic heart disease of native coronary artery without angina pectoris: Secondary | ICD-10-CM | POA: Diagnosis not present

## 2023-07-16 DIAGNOSIS — E7800 Pure hypercholesterolemia, unspecified: Secondary | ICD-10-CM | POA: Diagnosis not present

## 2023-07-16 DIAGNOSIS — I10 Essential (primary) hypertension: Secondary | ICD-10-CM

## 2023-07-16 MED ORDER — LISINOPRIL 40 MG PO TABS: 40.0000 mg | ORAL_TABLET | Freq: Every morning | ORAL | 1 refills | Status: DC

## 2023-07-16 MED ORDER — AMLODIPINE BESYLATE 5 MG PO TABS: 5.0000 mg | ORAL_TABLET | Freq: Every day | ORAL | 1 refills | Status: DC

## 2023-07-16 NOTE — Progress Notes (Signed)
Office Visit  Subjective   Patient ID: Joel Jenkins   DOB: 10/22/1950   Age: 72 y.o.   MRN: [id number]   Chief Complaint Chief Complaint  Patient presents with   Follow-up     History of Present Illness Joel Jenkins. Mccullar returns today for routine followup on his cholesterol.  Last year, I wanted to try him on lipitor but he did not want anymore statins.  We however noted his cholesterol was elevated on his yearly exam in 03/2023 and we started him on lipitor.  He has been tried on pravastatin, simvastatin and lovastatin which have all caused myalgias.  Overall, he states he is doing well and is without any complaints or problems at this time. He specifically denies abdominal pain, nausea, vomiting, diarrhea, myalgias, and fatigue. He remains on dietary management and lipitor 40mg  qhs. He is fasting in anticipation for labs today.    The patient is a 72 year old Caucasian/White male who presents for a follow-up evaluation of hypertension.  Since his last visit, there has been no problems.  The patient has been checking his blood pressure at home. The patient's blood pressure has ranged systollically in the 120-130's. The patient's current medications include: amlodipine 5mg  daily and lisinopril 40 mg daily. The patient has been tolerating his medications well. The patient denies any headache, visual changes, dizziness, lightheadness, chest pain, shortness of breath, weakness/numbness, and edema.  He reports there have been no other symptoms noted.   We did see him 3 months ago and noted he had some memory loss on his MMSE.  He has not noticed any memory loss but he does get forgetful at times.  His MMSE in 03/2023 showed a score of 24/30.  He scored a 29/30 last year.  The patient denies any new medications, depression or anxiety.  The patient was referred to cardiology by pulmonary where he saw cardiology on 04/06/2023 so he could be evaluated for coronary artery calcification and dyspnea on  exertion.   They noted recent coronary artery calcification on CT scan with COPD.  He had marked elevation in LDL.  He was not taking any medications for this for lipid lowering.  They discussed secondary prevention and they want him to undergo a lexiscan nuclear stress test and told him to start an ASA 81mg  daily.  He was noted to have a heart murmur and he had an ECHO done at pulmonary medicine that showed moderate mitral regurgitation.  They performed a Lexiscan nuclear stress test on 04/13/2023 where they study was normal. The study was low risk.  His left ventricular function was normal with a nuclear stress EF of 62%.      Past Medical History Past Medical History:  Diagnosis Date   Borderline glaucoma    BPH (benign prostatic hyperplasia)    COPD (chronic obstructive pulmonary disease) (HCC)    GERD (gastroesophageal reflux disease)    History of kidney stones    Hyperlipidemia    Hypertension    Nephrolithiasis 09/17/2015     Allergies No Known Allergies   Medications  Current Outpatient Medications:    amLODipine (NORVASC) 5 MG tablet, Take 1 tablet (5 mg total) by mouth daily., Disp: 90 tablet, Rfl: 1   aspirin EC 81 MG tablet, Take 1 tablet (81 mg total) by mouth daily. Swallow whole., Disp: , Rfl:    atorvastatin (LIPITOR) 40 MG tablet, Take 1 tablet (40 mg total) by mouth daily., Disp: 90 tablet, Rfl: 1  lisinopril (ZESTRIL) 40 MG tablet, Take 1 tablet (40 mg total) by mouth every morning., Disp: 90 tablet, Rfl: 1   nitroGLYCERIN (NITROSTAT) 0.4 MG SL tablet, Place 1 tablet (0.4 mg total) under the tongue every 5 (five) minutes as needed., Disp: 25 tablet, Rfl: 6   omeprazole (PRILOSEC) 20 MG capsule, Take 20 mg by mouth daily., Disp: , Rfl:    TRELEGY ELLIPTA 100-62.5-25 MCG/ACT AEPB, Inhale 1 puff into the lungs daily., Disp: 28 each, Rfl: 11   VENTOLIN HFA 108 (90 Base) MCG/ACT inhaler, Inhale 1-2 puffs into the lungs as needed for wheezing or shortness of breath., Disp:  , Rfl:    Review of Systems Review of Systems  Constitutional:  Negative for chills, fever and malaise/fatigue.  Eyes:  Negative for blurred vision and double vision.  Respiratory:  Negative for shortness of breath.   Cardiovascular:  Negative for chest pain, palpitations and leg swelling.  Gastrointestinal:  Negative for abdominal pain, constipation, diarrhea, nausea and vomiting.  Genitourinary:  Negative for frequency.  Musculoskeletal:  Negative for myalgias.  Skin:  Negative for itching and rash.  Neurological:  Negative for dizziness, weakness and headaches.  Endo/Heme/Allergies:  Negative for polydipsia.       Objective:    Vitals BP 134/82   Pulse 72   Resp 18   Ht 5\' 11"  (1.803 m)   Wt 159 lb 9.6 oz (72.4 kg)   SpO2 92%   BMI 22.26 kg/m    Physical Examination Physical Exam Constitutional:      Appearance: Normal appearance. He is not ill-appearing.  Neck:     Vascular: No carotid bruit.  Cardiovascular:     Rate and Rhythm: Normal rate and regular rhythm.     Pulses: Normal pulses.     Heart sounds: No murmur heard.    No friction rub. No gallop.  Pulmonary:     Effort: Pulmonary effort is normal. No respiratory distress.     Breath sounds: No wheezing, rhonchi or rales.  Abdominal:     General: Abdomen is flat. Bowel sounds are normal. There is no distension.     Palpations: Abdomen is soft.     Tenderness: There is no abdominal tenderness.  Musculoskeletal:     Right lower leg: No edema.     Left lower leg: No edema.  Skin:    General: Skin is warm and dry.     Findings: No rash.  Neurological:     General: No focal deficit present.     Mental Status: He is alert and oriented to person, place, and time.  Psychiatric:        Mood and Affect: Mood normal.        Behavior: Behavior normal.        Assessment & Plan:   Coronary artery calcification His nuclear stress test was negative.  We will continue with risk factor modification and ASA.  I  told him to take one 81mg  ASA.  Essential hypertension His BP is controlled at this time and we will continue on his current medications.  Hypercholesterolemia We will check his FLP today and see how his choelsterol is doing.  Memory loss We will repeat a MMSE on his next visit.    Prediabetes He was diagnosed with prediabetes on lab testing in 2022.  We will check a HgBA1c.  He is watching his diet and being active.    Return in about 3 months (around 10/16/2023).   Crist Fat,  MD

## 2023-07-16 NOTE — Assessment & Plan Note (Signed)
We will check his FLP today and see how his choelsterol is doing.

## 2023-07-16 NOTE — Assessment & Plan Note (Signed)
He was diagnosed with prediabetes on lab testing in 2022.  We will check a HgBA1c.  He is watching his diet and being active.

## 2023-07-16 NOTE — Assessment & Plan Note (Signed)
His BP is controlled at this time and we will continue on his current medications.

## 2023-07-16 NOTE — Assessment & Plan Note (Addendum)
His nuclear stress test was negative.  We will continue with risk factor modification and ASA.  I told him to take one 81mg  ASA.

## 2023-07-16 NOTE — Assessment & Plan Note (Signed)
We will repeat a MMSE on his next visit.

## 2023-07-17 LAB — LIPID PANEL
Chol/HDL Ratio: 3 ratio (ref 0.0–5.0)
Cholesterol, Total: 166 mg/dL (ref 100–199)
HDL: 55 mg/dL (ref >39)
LDL Chol Calc (NIH): 99 mg/dL (ref 0–99)
Triglycerides: 62 mg/dL (ref 0–149)
VLDL Cholesterol Cal: 12 mg/dL (ref 5–40)

## 2023-07-17 LAB — HEMOGLOBIN A1C
Est. average glucose Bld gHb Est-mCnc: 131 mg/dL
Hgb A1c MFr Bld: 6.2 % — ABNORMAL HIGH (ref 4.8–5.6)

## 2023-08-24 NOTE — Progress Notes (Signed)
His prediabetes and other labs look good.  Patient is aware of Labs

## 2023-09-03 DIAGNOSIS — J449 Chronic obstructive pulmonary disease, unspecified: Secondary | ICD-10-CM | POA: Diagnosis not present

## 2023-09-03 DIAGNOSIS — I2510 Atherosclerotic heart disease of native coronary artery without angina pectoris: Secondary | ICD-10-CM | POA: Diagnosis not present

## 2023-09-03 DIAGNOSIS — J301 Allergic rhinitis due to pollen: Secondary | ICD-10-CM | POA: Diagnosis not present

## 2023-09-03 DIAGNOSIS — R918 Other nonspecific abnormal finding of lung field: Secondary | ICD-10-CM | POA: Diagnosis not present

## 2023-10-12 ENCOUNTER — Ambulatory Visit: Payer: Medicare Other | Admitting: Internal Medicine

## 2023-12-03 DIAGNOSIS — J301 Allergic rhinitis due to pollen: Secondary | ICD-10-CM | POA: Diagnosis not present

## 2023-12-03 DIAGNOSIS — R918 Other nonspecific abnormal finding of lung field: Secondary | ICD-10-CM | POA: Diagnosis not present

## 2023-12-03 DIAGNOSIS — J449 Chronic obstructive pulmonary disease, unspecified: Secondary | ICD-10-CM | POA: Diagnosis not present

## 2023-12-03 DIAGNOSIS — I2510 Atherosclerotic heart disease of native coronary artery without angina pectoris: Secondary | ICD-10-CM | POA: Diagnosis not present

## 2024-02-28 DIAGNOSIS — I2510 Atherosclerotic heart disease of native coronary artery without angina pectoris: Secondary | ICD-10-CM | POA: Diagnosis not present

## 2024-02-28 DIAGNOSIS — J301 Allergic rhinitis due to pollen: Secondary | ICD-10-CM | POA: Diagnosis not present

## 2024-02-28 DIAGNOSIS — J449 Chronic obstructive pulmonary disease, unspecified: Secondary | ICD-10-CM | POA: Diagnosis not present

## 2024-02-28 DIAGNOSIS — R918 Other nonspecific abnormal finding of lung field: Secondary | ICD-10-CM | POA: Diagnosis not present

## 2024-04-19 ENCOUNTER — Encounter: Payer: Medicare Other | Admitting: Internal Medicine

## 2024-04-20 ENCOUNTER — Ambulatory Visit: Admitting: Internal Medicine

## 2024-04-20 ENCOUNTER — Encounter: Payer: Self-pay | Admitting: Internal Medicine

## 2024-04-20 VITALS — BP 152/78 | HR 58 | Temp 97.7°F | Resp 18 | Ht 71.0 in | Wt 150.6 lb

## 2024-04-20 DIAGNOSIS — K219 Gastro-esophageal reflux disease without esophagitis: Secondary | ICD-10-CM

## 2024-04-20 DIAGNOSIS — Z0000 Encounter for general adult medical examination without abnormal findings: Secondary | ICD-10-CM | POA: Diagnosis not present

## 2024-04-20 DIAGNOSIS — Z1211 Encounter for screening for malignant neoplasm of colon: Secondary | ICD-10-CM

## 2024-04-20 DIAGNOSIS — N400 Enlarged prostate without lower urinary tract symptoms: Secondary | ICD-10-CM | POA: Diagnosis not present

## 2024-04-20 DIAGNOSIS — I2510 Atherosclerotic heart disease of native coronary artery without angina pectoris: Secondary | ICD-10-CM | POA: Diagnosis not present

## 2024-04-20 DIAGNOSIS — R7303 Prediabetes: Secondary | ICD-10-CM | POA: Diagnosis not present

## 2024-04-20 DIAGNOSIS — E782 Mixed hyperlipidemia: Secondary | ICD-10-CM | POA: Diagnosis not present

## 2024-04-20 DIAGNOSIS — Z1331 Encounter for screening for depression: Secondary | ICD-10-CM | POA: Diagnosis not present

## 2024-04-20 DIAGNOSIS — H3530 Unspecified macular degeneration: Secondary | ICD-10-CM | POA: Diagnosis not present

## 2024-04-20 DIAGNOSIS — J449 Chronic obstructive pulmonary disease, unspecified: Secondary | ICD-10-CM | POA: Diagnosis not present

## 2024-04-20 DIAGNOSIS — Z6821 Body mass index (BMI) 21.0-21.9, adult: Secondary | ICD-10-CM | POA: Diagnosis not present

## 2024-04-20 DIAGNOSIS — I10 Essential (primary) hypertension: Secondary | ICD-10-CM

## 2024-04-20 DIAGNOSIS — I340 Nonrheumatic mitral (valve) insufficiency: Secondary | ICD-10-CM | POA: Diagnosis not present

## 2024-04-20 MED ORDER — ATORVASTATIN CALCIUM 40 MG PO TABS: 40.0000 mg | ORAL_TABLET | Freq: Every day | ORAL | 1 refills | Status: AC

## 2024-04-20 MED ORDER — TRELEGY ELLIPTA 100-62.5-25 MCG/ACT IN AEPB: 1.0000 | INHALATION_SPRAY | Freq: Every day | RESPIRATORY_TRACT | 11 refills | Status: AC

## 2024-04-20 MED ORDER — LISINOPRIL 40 MG PO TABS: 40.0000 mg | ORAL_TABLET | Freq: Every morning | ORAL | 1 refills | Status: AC

## 2024-04-20 MED ORDER — VENTOLIN HFA 108 (90 BASE) MCG/ACT IN AERS: 1.0000 | INHALATION_SPRAY | RESPIRATORY_TRACT | 5 refills | Status: AC | PRN

## 2024-04-20 MED ORDER — AMLODIPINE BESYLATE 5 MG PO TABS: 5.0000 mg | ORAL_TABLET | Freq: Every day | ORAL | 1 refills | Status: AC

## 2024-04-20 NOTE — Assessment & Plan Note (Signed)
 This was noted on his ECHO in 2024 at pulmonary.  He will need a repeat ECHO at some point.  I do not hear a murmur on exam today.

## 2024-04-20 NOTE — Assessment & Plan Note (Signed)
 He will continue to followup at [facility].

## 2024-04-20 NOTE — Progress Notes (Signed)
 Preventive Screening-Counseling & Management    Joel Jenkins is a 73 year old Caucasian/White male who presents for his annual wellness exam. He is due for the following health maintenance studies: visual exam, colonoscopy, and screening labs. This patient's past medical history Benign Prostatic Hypertrophy, COPD, Hyperlipidemia, Hypertension, Benign Essential, Kidney Stones, and Tobacco Abuse.    His last eye exam was done 05/2023 where they are following him for macular degeneration.  His vision has slowly worsened.  He states his center vision is gone but there is no change from last year.  He has an appointment to see the ophthalmologist in 04/2024.  He performed a FIT test on 03/2023, 04/2021 and 08/2019 and this was negative for fecal blood. He has never had a colonoscopy and refuses to have that done. He has a history of BPH and kidney stones and he denies any recent kidney stones and no problems with urination.  He quit smoking in 2005 where he smoked about 30-40 years.  The patient does not exercise regularly. He does not get yearly flu vaccines. He had a Prevnar 13 vaccine in 09/2019 and he had the pneumovax 23 vaccine in 02/2021.  We discussed the shingrix vaccine in 03/2023 but he deferrred this.  He has had 2 COVID-19 vaccines but no boosters. He denies any memory loss, depression or anxiety. The patient is on an ASA 81mg  daily.   We noted that his MMSE from 03/2023 showed a score of 24/30.  He scored a 29/30 in 2023.  He scored a 25/30 today on 03/2024.  The patient denies any problems with his memory.  He has not noticed any memory loss but he does get forgetful at times.  He is having some situational mood disorder with his wife's recent diagnosis of a pancreatic mass.  I arranged for a CT guided liver biopsy for tissue diagnosis which was performed yesterday.  The patient was admitted this morning by GI to do an EUS/EGD.  [name] returns today for routine followup on his cholesterol.   Last year, I wanted to try him on lipitor but he did not want anymore statins.  We however noted his cholesterol was elevated on his yearly exam in 03/2023 and we started him on lipitor.  He has been tried on pravastatin, simvastatin and lovastatin which have all caused myalgias.  Overall, he states he is doing well and is without any complaints or problems at this time. He specifically denies abdominal pain, nausea, vomiting, diarrhea, myalgias, and fatigue. He remains on dietary management and lipitor 40mg  qhs. He is fasting in anticipation for labs today.    The patient is a 73 year old Caucasian/White male who presents for a follow-up evaluation of hypertension.  Since his last visit, there has been no problems.  The patient has been checking his blood pressure at home. The patient's blood pressure has ranged systollically in the 130's. The patient's current medications include: amlodipine  5mg  daily and lisinopril  40 mg daily. The patient has been tolerating his medications well. The patient denies any headache, visual changes, dizziness, lightheadness, chest pain, shortness of breath, weakness/numbness, and edema.  He reports there have been no other symptoms noted.  His BP is up today but he is crying due to his wife's recent diagnosis with probable cancer.   The patient was referred to cardiology by pulmonary where he saw cardiology on 04/06/2023 so he could be evaluated for coronary artery calcification and dyspnea on exertion.  They noted recent coronary artery calcification on CT scan with COPD.  He had marked elevation in LDL.  He was not taking any medications for this for lipid lowering.  They discussed secondary prevention and they want him to undergo a lexiscan  nuclear stress test and told him to start an ASA 81mg  daily.  He was noted to have a heart murmur and he had an ECHO done at pulmonary medicine that showed moderate mitral regurgitation.  They performed a Lexiscan  nuclear stress test on  04/13/2023 where they study was normal. The study was low risk.  His left ventricular function was normal with a nuclear stress EF of 62%.   The patient also has a history of prediabetes that was diagnosed on routine last testing in 2022.  Since his last visit, he has not had any problems.  He controls his prediabetes with diet.  He currently does not check his FSBS.  He is fasting today for his yearly labs.   The patient is a 72 year old Caucasian/White male who presents with a moderately severe COPD of years duration and presents today for a status visit. He states he did smoke and quit in 2005. He was diagnosed with COPD by pulmonary in 2019. I saw him for his COPD 09/2018 where he had a COPD exacerbation with bronchitis. I wanted him to start anoro at that time but he could not afford it.  He is followed by Dr. Beulah in pulmonary who saw him a few months ago and started him on trelegy and he uses albutrol HFA as needed.  The patient states this is helping his breathing.  They also started him on oxygen  2L via nasal canula at night.  He no longer takes any nebulization treatments.  He cannot take ipratropium due to the cholingeric side effects of difficulty urinating.  See PMH for summary of pulmonary history and lab reports for status of any previous PFTs: COPD. Comorbid conditions : none. He has the following baseline symptoms: exertional dyspnea. The exertional dyspnea is brought on by the equivalent of climbing 13 stairs and there has been no change in this in the past year. The patient's condition is controlled by medications as summarized in the medication list on the face sheet. He has no modifiable risk factors. Specifically denied complaints: worsening exertional dyspnea, increased wheezing, productive cough, change in color of sputum, pleuritic chest pain, purulent sputum, hemoptysis, and fever.        Are there smokers in your home (other than you)? No  Risk Factors Current exercise  habits: as above  Dietary issues discussed: no   Depression Screen (Note: if answer to either of the following is Yes, a more complete depression screening is indicated)   Over the past two weeks, have you felt down, depressed or hopeless? No  Over the past two weeks, have you felt little interest or pleasure in doing things? No  Have you lost interest or pleasure in daily life? No  Do you often feel hopeless? No  Do you cry easily over simple problems? No  Activities of Daily Living In your present state of health, do you have any difficulty performing the following activities?:  Driving? No Managing money?  No Feeding yourself? No Getting from bed to chair? No Climbing a flight of stairs? No Preparing food and eating?: No Bathing or showering? No Getting dressed: No Getting to the toilet? No Using the toilet:No Moving around from place to place: No In the past year  have you fallen or had a near fall?:No    Hearing Difficulties: No Do you often ask people to speak up or repeat themselves? No Do you experience ringing or noises in your ears? No Do you have difficulty understanding soft or whispered voices? No   Do you feel that you have a problem with memory? No  Do you often misplace items? No  Do you feel safe at home?  Yes  Cognitive Testing  Alert? Yes  Normal Appearance?Yes  Oriented to person? Yes  Place? Yes   Time? Yes  Recall of three objects?  Yes  Can perform simple calculations? No  Displays appropriate judgment?Yes  Can read the correct time from a watch face?Yes  Fall Risk Prevention  Any stairs in or around the home? Yes  If so, are there any without handrails? Yes  Home free of loose throw rugs in walkways, pet beds, electrical cords, etc? Yes  Adequate lighting in your home to reduce risk of falls? Yes  Use of a cane, walker or w/c? No    Time Up and Go  Was the test performed? Yes .  Length of time to ambulate 10 feet: 9 sec.   Gait  steady and fast without use of assistive device    Advanced Directives have been discussed with the patient? Yes   List the Names of Other Physician/Practitioners you currently use: Patient Care Team: Fleeta Valeria Mayo, MD as PCP - General (Internal Medicine)    Past Medical History:  Diagnosis Date   Borderline glaucoma    BPH (benign prostatic hyperplasia)    COPD (chronic obstructive pulmonary disease) (HCC)    GERD (gastroesophageal reflux disease)    History of kidney stones    Hyperlipidemia    Hypertension    Nephrolithiasis 09/17/2015    Past Surgical History:  Procedure Laterality Date   CYSTO/  URETEROSCOPIC STONE EXTRACTION  1980   CYSTOSCOPY WITH URETEROSCOPY AND STENT PLACEMENT Right 10/21/2015   Procedure: CYSTOSCOPY RIGHT URETEROSCOPY RIGHT STENT EXCHANGE;  Surgeon: Redell Lynwood Napoleon, MD;  Location: [facility];  Service: Urology;  Laterality: Right;   HOLMIUM LASER APPLICATION Right 10/21/2015   Procedure: HOLMIUM LASER APPLICATION;  Surgeon: Redell Lynwood Napoleon, MD;  Location: [facility];  Service: Urology;  Laterality: Right;   NEPHROLITHOTOMY Right 09/17/2015   Procedure: RIGHT  PERCUTANEOUS NEPHROLITHOTOMY ;  Surgeon: Redell Lynwood Napoleon, MD;  Location: [facility];  Service: Urology;  Laterality: Right;      Current Medications  Current Outpatient Medications  Medication Sig Dispense Refill   amLODipine  (NORVASC ) 5 MG tablet Take 1 tablet (5 mg total) by mouth daily. 90 tablet 1   aspirin  EC 81 MG tablet Take 1 tablet (81 mg total) by mouth daily. Swallow whole.     atorvastatin  (LIPITOR) 40 MG tablet Take 1 tablet (40 mg total) by mouth daily. 90 tablet 1   lisinopril  (ZESTRIL ) 40 MG tablet Take 1 tablet (40 mg total) by mouth every morning. 90 tablet 1   nitroGLYCERIN  (NITROSTAT ) 0.4 MG SL tablet Place 1 tablet (0.4 mg total) under the tongue every 5 (five) minutes as needed. 25 tablet 6   omeprazole  (PRILOSEC ) 20 MG capsule Take 20  mg by mouth daily.     TRELEGY ELLIPTA  100-62.5-25 MCG/ACT AEPB Inhale 1 puff into the lungs daily. 28 each 11   VENTOLIN  HFA 108 (90 Base) MCG/ACT inhaler Inhale 1-2 puffs into the lungs as needed for wheezing or shortness of  breath.     No current facility-administered medications for this visit.    Allergies Patient has no known allergies.   Social History Social History   Tobacco Use   Smoking status: Former    Current packs/day: 0.00    Average packs/day: 2.0 packs/day for 30.0 years (60.0 ttl pk-yrs)    Types: Cigarettes    Start date: 09/12/1979    Quit date: 09/11/2009    Years since quitting: 14.6   Smokeless tobacco: Current    Types: Snuff   Tobacco comments:    OCCASIONAL DIP TOBACCO  Substance Use Topics   Alcohol  use: No     Review of Systems Review of Systems  Constitutional:  Negative for chills, fever and malaise/fatigue.  Eyes:  Negative for blurred vision and double vision.  Respiratory:  Negative for cough, hemoptysis, shortness of breath and wheezing.   Cardiovascular:  Negative for chest pain, palpitations and leg swelling.  Gastrointestinal:  Negative for abdominal pain, blood in stool, constipation, diarrhea, heartburn, melena, nausea and vomiting.  Genitourinary:  Negative for frequency and hematuria.  Musculoskeletal:  Negative for myalgias.  Skin:  Negative for itching and rash.  Neurological:  Negative for dizziness, weakness and headaches.  Endo/Heme/Allergies:  Negative for polydipsia.  Psychiatric/Behavioral:  Negative for substance abuse. The patient does not have insomnia.      Physical Exam:      Body mass index is 21 kg/m. BP (!) 152/78   Pulse (!) 58   Temp 97.7 F (36.5 C) (Temporal)   Resp 18   Ht 5' 11 (1.803 m)   Wt 150 lb 9.6 oz (68.3 kg)   SpO2 95%   BMI 21.00 kg/m   Physical Exam Constitutional:      Appearance: Normal appearance. He is not ill-appearing.  HENT:     Head: Normocephalic and atraumatic.      Right Ear: Tympanic membrane, ear canal and external ear normal.     Left Ear: Tympanic membrane, ear canal and external ear normal.     Nose: Nose normal. No congestion or rhinorrhea.     Mouth/Throat:     Mouth: Mucous membranes are dry.     Pharynx: Oropharynx is clear. No oropharyngeal exudate or posterior oropharyngeal erythema.  Eyes:     General: No scleral icterus.    Conjunctiva/sclera: Conjunctivae normal.     Pupils: Pupils are equal, round, and reactive to light.  Neck:     Vascular: No carotid bruit.  Cardiovascular:     Rate and Rhythm: Normal rate and regular rhythm.     Pulses: Normal pulses.     Heart sounds: No murmur heard.    No friction rub. No gallop.  Pulmonary:     Effort: Pulmonary effort is normal. No respiratory distress.     Breath sounds: No wheezing, rhonchi or rales.  Abdominal:     General: Abdomen is flat. Bowel sounds are normal. There is no distension.     Palpations: Abdomen is soft.     Tenderness: There is no abdominal tenderness.  Musculoskeletal:     Cervical back: Neck supple. No tenderness.     Right lower leg: No edema.     Left lower leg: No edema.  Lymphadenopathy:     Cervical: No cervical adenopathy.  Skin:    General: Skin is warm and dry.     Findings: No rash.  Neurological:     General: No focal deficit present.     Mental Status:  He is alert and oriented to person, place, and time.  Psychiatric:        Mood and Affect: Mood normal.        Behavior: Behavior normal.      Assessment:      Essential hypertension  Prediabetes  Coronary artery calcification  Benign prostatic hyperplasia without lower urinary tract symptoms  Hyperlipidemia, mixed  Chronic obstructive pulmonary disease, unspecified COPD type (HCC)  Moderate mitral regurgitation  Gastroesophageal reflux disease, unspecified whether esophagitis present  Macular degeneration of both eyes, unspecified type  BMI 21.0-21.9, adult  Screening for  colon cancer    Plan:     During the course of the visit the patient was educated and counseled about appropriate screening and preventive services including:   Pneumococcal vaccine  Influenza vaccine Colorectal cancer screening Advanced directives: discussed  Diet review for nutrition referral? Yes ____  Not Indicated ___X_   Patient Instructions (the written plan) was given to the patient.  Moderate mitral regurgitation This was noted on his ECHO in 2024 at pulmonary.  He will need a repeat ECHO at some point.  I do not hear a murmur on exam today.  Essential hypertension His BP is slightly elevated today but he is upset.  We will see what his BP is running on his next visit.  Coronary artery calcification He had a workup last year with lexiscan  which was negative.  We need to continue with risk factor modification with control of his prediabetes, HTN and HLD.  He remains on an ASA.  COPD (chronic obstructive pulmonary disease) (HCC) He remains on trelegy and albuterol  HFA which he uses usually once per day.  There is no change in his baseline DOE.  GERD (gastroesophageal reflux disease) He remains on omeprazole  at this time which controls his reflux.  Benign prostatic hyperplasia without lower urinary tract symptoms He denies any problems with urination.  Prediabetes We will check a HgBA1c on him at this time.  Macular degeneration of both eyes He will continue to followup at [facility].  Hyperlipidemia, mixed His LDL needs to be less than 70 with his coronary calcifications.  He needs to watch his diet and exercise.  We will continue on his statin.  BMI 21.0-21.9, adult I want him to eat healthy and be more active.   Prevention Health maintenance discussed.  He does not want a colonoscopy but we discussed cologuard and he is willing to do this.  He does not want a shingrix vaccine.  We will obtain some yearly labs.   Medicare Attestation I have personally  reviewed: The patient's medical and social history Their use of alcohol , tobacco or illicit drugs Their current medications and supplements The patient's functional ability including ADLs,fall risks, home safety risks, cognitive, and hearing and visual impairment Diet and physical activities Evidence for depression or mood disorders  The patient's weight, height, and BMI have been recorded in the chart.  I have made referrals, counseling, and provided education to the patient based on review of the above and I have provided the patient with a written personalized care plan for preventive services.     Selinda Fleeta Finger, MD   04/20/2024

## 2024-04-20 NOTE — Assessment & Plan Note (Signed)
 His LDL needs to be less than 70 with his coronary calcifications.  He needs to watch his diet and exercise.  We will continue on his statin.

## 2024-04-20 NOTE — Assessment & Plan Note (Addendum)
 He had a workup last year with lexiscan  which was negative.  We need to continue with risk factor modification with control of his prediabetes, HTN and HLD.  He remains on an ASA.

## 2024-04-20 NOTE — Assessment & Plan Note (Signed)
 His BP is slightly elevated today but he is upset.  We will see what his BP is running on his next visit.

## 2024-04-20 NOTE — Assessment & Plan Note (Signed)
I want him to eat healthy and be more active.

## 2024-04-20 NOTE — Assessment & Plan Note (Signed)
 We will check a HgBA1c on him at this time.

## 2024-04-20 NOTE — Assessment & Plan Note (Signed)
 He remains on trelegy and albuterol  HFA which he uses usually once per day.  There is no change in his baseline DOE.

## 2024-04-20 NOTE — Assessment & Plan Note (Signed)
 He denies any problems with urination.

## 2024-04-20 NOTE — Assessment & Plan Note (Signed)
 He remains on omeprazole  at this time which controls his reflux.

## 2024-04-21 LAB — CBC WITH DIFFERENTIAL/PLATELET
Basophils Absolute: 0 x10E3/uL (ref 0.0–0.2)
Basos: 1 %
EOS (ABSOLUTE): 0.2 x10E3/uL (ref 0.0–0.4)
Eos: 3 %
Hematocrit: 45.7 % (ref 37.5–51.0)
Hemoglobin: 15 g/dL (ref 13.0–17.7)
Immature Grans (Abs): 0 x10E3/uL (ref 0.0–0.1)
Immature Granulocytes: 0 %
Lymphocytes Absolute: 1.1 x10E3/uL (ref 0.7–3.1)
Lymphs: 20 %
MCH: 31.2 pg (ref 26.6–33.0)
MCHC: 32.8 g/dL (ref 31.5–35.7)
MCV: 95 fL (ref 79–97)
Monocytes Absolute: 0.4 x10E3/uL (ref 0.1–0.9)
Monocytes: 8 %
Neutrophils Absolute: 3.7 x10E3/uL (ref 1.4–7.0)
Neutrophils: 68 %
Platelets: 204 x10E3/uL (ref 150–450)
RBC: 4.81 x10E6/uL (ref 4.14–5.80)
RDW: 12.7 % (ref 11.6–15.4)
WBC: 5.4 x10E3/uL (ref 3.4–10.8)

## 2024-04-21 LAB — CMP14 + ANION GAP
ALT: 23 IU/L (ref 0–44)
AST: 17 IU/L (ref 0–40)
Albumin: 4.8 g/dL (ref 3.8–4.8)
Alkaline Phosphatase: 83 IU/L (ref 44–121)
Anion Gap: 21 mmol/L — ABNORMAL HIGH (ref 10.0–18.0)
BUN/Creatinine Ratio: 18 (ref 10–24)
BUN: 17 mg/dL (ref 8–27)
Bilirubin Total: 0.7 mg/dL (ref 0.0–1.2)
CO2: 20 mmol/L (ref 20–29)
Calcium: 10.2 mg/dL (ref 8.6–10.2)
Chloride: 103 mmol/L (ref 96–106)
Creatinine, Ser: 0.93 mg/dL (ref 0.76–1.27)
Globulin, Total: 2.5 g/dL (ref 1.5–4.5)
Glucose: 100 mg/dL — ABNORMAL HIGH (ref 70–99)
Potassium: 5.1 mmol/L (ref 3.5–5.2)
Sodium: 144 mmol/L (ref 134–144)
Total Protein: 7.3 g/dL (ref 6.0–8.5)
eGFR: 87 mL/min/1.73 (ref >59)

## 2024-04-21 LAB — LIPID PANEL
Chol/HDL Ratio: 3.1 ratio (ref 0.0–5.0)
Cholesterol, Total: 162 mg/dL (ref 100–199)
HDL: 52 mg/dL (ref >39)
LDL Chol Calc (NIH): 99 mg/dL (ref 0–99)
Triglycerides: 53 mg/dL (ref 0–149)
VLDL Cholesterol Cal: 11 mg/dL (ref 5–40)

## 2024-04-21 LAB — HEMOGLOBIN A1C
Est. average glucose Bld gHb Est-mCnc: 131 mg/dL
Hgb A1c MFr Bld: 6.2 % — ABNORMAL HIGH (ref 4.8–5.6)

## 2024-04-21 LAB — PSA: Prostate Specific Ag, Serum: 2.2 ng/mL (ref 0.0–4.0)

## 2024-04-21 LAB — TSH: TSH: 1.01 u[IU]/mL (ref 0.450–4.500)

## 2024-04-27 ENCOUNTER — Ambulatory Visit: Payer: Self-pay

## 2024-04-27 NOTE — Progress Notes (Signed)
 Patient called.  Patient aware.  I have called and informed the patient  His labs look good.  His prediabetes is controlled. Joel Jenkins  Pt aware.

## 2024-04-28 DIAGNOSIS — Z1211 Encounter for screening for malignant neoplasm of colon: Secondary | ICD-10-CM | POA: Diagnosis not present

## 2024-05-05 LAB — COLOGUARD: COLOGUARD: NEGATIVE

## 2024-05-26 NOTE — Progress Notes (Signed)
 Patient called.  Patient aware.  I have called and informed the patient  His cologuard is negative. Joel Jenkins

## 2024-06-02 DIAGNOSIS — J301 Allergic rhinitis due to pollen: Secondary | ICD-10-CM | POA: Diagnosis not present

## 2024-06-02 DIAGNOSIS — J449 Chronic obstructive pulmonary disease, unspecified: Secondary | ICD-10-CM | POA: Diagnosis not present

## 2024-06-02 DIAGNOSIS — R918 Other nonspecific abnormal finding of lung field: Secondary | ICD-10-CM | POA: Diagnosis not present

## 2024-06-02 DIAGNOSIS — I2510 Atherosclerotic heart disease of native coronary artery without angina pectoris: Secondary | ICD-10-CM | POA: Diagnosis not present

## 2024-06-02 DIAGNOSIS — Z87891 Personal history of nicotine dependence: Secondary | ICD-10-CM | POA: Diagnosis not present

## 2024-06-12 DIAGNOSIS — H353132 Nonexudative age-related macular degeneration, bilateral, intermediate dry stage: Secondary | ICD-10-CM | POA: Diagnosis not present

## 2024-06-12 DIAGNOSIS — H43392 Other vitreous opacities, left eye: Secondary | ICD-10-CM | POA: Diagnosis not present

## 2024-06-12 DIAGNOSIS — H43811 Vitreous degeneration, right eye: Secondary | ICD-10-CM | POA: Diagnosis not present

## 2024-06-12 DIAGNOSIS — D3132 Benign neoplasm of left choroid: Secondary | ICD-10-CM | POA: Diagnosis not present

## 2024-06-23 DIAGNOSIS — Z122 Encounter for screening for malignant neoplasm of respiratory organs: Secondary | ICD-10-CM | POA: Diagnosis not present

## 2024-06-23 DIAGNOSIS — Z87891 Personal history of nicotine dependence: Secondary | ICD-10-CM | POA: Diagnosis not present

## 2024-07-21 ENCOUNTER — Ambulatory Visit: Admitting: Internal Medicine

## 2024-07-27 ENCOUNTER — Encounter: Payer: Self-pay | Admitting: Internal Medicine

## 2024-07-27 ENCOUNTER — Ambulatory Visit: Admitting: Internal Medicine

## 2024-07-27 VITALS — BP 140/78 | HR 62 | Temp 98.1°F | Resp 16 | Ht 71.0 in | Wt 149.4 lb

## 2024-07-27 DIAGNOSIS — E782 Mixed hyperlipidemia: Secondary | ICD-10-CM | POA: Diagnosis not present

## 2024-07-27 DIAGNOSIS — R7303 Prediabetes: Secondary | ICD-10-CM | POA: Diagnosis not present

## 2024-07-27 DIAGNOSIS — I10 Essential (primary) hypertension: Secondary | ICD-10-CM | POA: Diagnosis not present

## 2024-07-27 NOTE — Assessment & Plan Note (Signed)
 His BP is borderline.  We will see what his BP is doing on his next visit before adjusting his BP meds.

## 2024-07-27 NOTE — Assessment & Plan Note (Signed)
 We will check his HgBa1c again in 3 months.  Continue control with diet and exercise.

## 2024-07-27 NOTE — Assessment & Plan Note (Signed)
 We will check his FLP on his next visit.  Continue on his current dose of atorvastatin .

## 2024-07-27 NOTE — Progress Notes (Signed)
 Office Visit  Subjective   Patient ID: Joel Jenkins   DOB: 05-07-1951   Age: 73 y.o.   MRN: [id number]   Chief Complaint Chief Complaint  Patient presents with   Follow-up    3 Month follow up     History of Present Illness The patient also has a history of prediabetes.  This was diagnosed on routine last testing in 2022.  Since his last visit, he has not had any problems.  He controls his prediabetes with diet.  He currently does not check his FSBS.  He is fasting today for his yearly labs.  His last HgBa1c was done 3 months ago and was 6.2%.    The patient is a 73 year old Caucasian/White male who presents for a follow-up evaluation of hypertension.  On his last visit, his BP was elevated but he was upset at what was going on with his wifes recent cancer diagnosis.  Since his last visit, there has been no problems.  The patient has been checking his blood pressure at home. The patient's blood pressure has ranged systollically in the 120-130's. The patient's current medications include: amlodipine  5mg  daily and lisinopril  40 mg daily. The patient has been tolerating his medications well. The patient denies any headache, visual changes, dizziness, lightheadness, chest pain, shortness of breath, weakness/numbness, and edema.  He reports there have been no other symptoms noted.     Joel Jenkins returns today for routine followup on his cholesterol.  Last year, I wanted to try him on lipitor but he did not want anymore statins.  We however noted his cholesterol was elevated on his yearly exam in 03/2023 and we started him on lipitor.  He has been tried on pravastatin, simvastatin and lovastatin which have all caused myalgias.  Overall, he states he is doing well and is without any complaints or problems at this time. He specifically denies abdominal pain, nausea, vomiting, diarrhea, myalgias, and fatigue. He remains on dietary management and lipitor 40mg  qhs. He is fasting in anticipation for  labs today.        Past Medical History Past Medical History:  Diagnosis Date   Borderline glaucoma    BPH (benign prostatic hyperplasia)    COPD (chronic obstructive pulmonary disease) (HCC)    GERD (gastroesophageal reflux disease)    History of kidney stones    Hyperlipidemia    Hypertension    Nephrolithiasis 09/17/2015     Allergies No Known Allergies   Medications  Current Outpatient Medications:    amLODipine  (NORVASC ) 5 MG tablet, Take 1 tablet (5 mg total) by mouth daily., Disp: 90 tablet, Rfl: 1   aspirin  EC 81 MG tablet, Take 1 tablet (81 mg total) by mouth daily. Swallow whole., Disp: , Rfl:    atorvastatin  (LIPITOR) 40 MG tablet, Take 1 tablet (40 mg total) by mouth daily., Disp: 90 tablet, Rfl: 1   lisinopril  (ZESTRIL ) 40 MG tablet, Take 1 tablet (40 mg total) by mouth every morning., Disp: 90 tablet, Rfl: 1   nitroGLYCERIN  (NITROSTAT ) 0.4 MG SL tablet, Place 1 tablet (0.4 mg total) under the tongue every 5 (five) minutes as needed., Disp: 25 tablet, Rfl: 6   omeprazole  (PRILOSEC ) 20 MG capsule, Take 20 mg by mouth daily., Disp: , Rfl:    TRELEGY ELLIPTA  100-62.5-25 MCG/ACT AEPB, Inhale 1 puff into the lungs daily., Disp: 28 each, Rfl: 11   VENTOLIN  HFA 108 (90 Base) MCG/ACT inhaler, Inhale 1-2 puffs into the lungs as  needed for wheezing or shortness of breath., Disp: 1 each, Rfl: 5   Review of Systems Review of Systems  Constitutional:  Negative for chills, fever and malaise/fatigue.  Eyes:  Negative for blurred vision and double vision.  Respiratory:  Negative for cough and shortness of breath.   Cardiovascular:  Negative for chest pain, palpitations and leg swelling.  Gastrointestinal:  Negative for abdominal pain, constipation, diarrhea, nausea and vomiting.  Genitourinary:  Negative for frequency.  Musculoskeletal:  Negative for myalgias.  Skin:  Negative for itching and rash.  Neurological:  Negative for dizziness, weakness and headaches.   Endo/Heme/Allergies:  Negative for polydipsia.       Objective:    Vitals BP (!) 140/78   Pulse 62   Temp 98.1 F (36.7 C) (Temporal)   Resp 16   Ht 5' 11 (1.803 m)   Wt 149 lb 6.4 oz (67.8 kg)   SpO2 97%   BMI 20.84 kg/m    Physical Examination Physical Exam Constitutional:      Appearance: Normal appearance. He is not ill-appearing.  Cardiovascular:     Rate and Rhythm: Normal rate and regular rhythm.     Pulses: Normal pulses.     Heart sounds: No murmur heard.    No friction rub. No gallop.  Pulmonary:     Effort: Pulmonary effort is normal. No respiratory distress.     Breath sounds: No wheezing, rhonchi or rales.  Abdominal:     General: Abdomen is flat. Bowel sounds are normal. There is no distension.     Palpations: Abdomen is soft.     Tenderness: There is no abdominal tenderness.  Musculoskeletal:     Right lower leg: No edema.     Left lower leg: No edema.  Skin:    General: Skin is warm and dry.     Findings: No rash.  Neurological:     General: No focal deficit present.     Mental Status: He is alert and oriented to person, place, and time.  Psychiatric:        Mood and Affect: Mood normal.        Behavior: Behavior normal.        Assessment & Plan:   Essential hypertension His BP is borderline.  We will see what his BP is doing on his next visit before adjusting his BP meds.  Prediabetes We will check his HgBa1c again in 3 months.  Continue control with diet and exercise.  Hyperlipidemia, mixed We will check his FLP on his next visit.  Continue on his current dose of atorvastatin .    Return in about 3 months (around 10/27/2024).   Selinda Fleeta Finger, MD

## 2024-07-31 DIAGNOSIS — Z87891 Personal history of nicotine dependence: Secondary | ICD-10-CM | POA: Diagnosis not present

## 2024-07-31 DIAGNOSIS — J301 Allergic rhinitis due to pollen: Secondary | ICD-10-CM | POA: Diagnosis not present

## 2024-07-31 DIAGNOSIS — J449 Chronic obstructive pulmonary disease, unspecified: Secondary | ICD-10-CM | POA: Diagnosis not present

## 2024-10-25 ENCOUNTER — Ambulatory Visit: Admitting: Internal Medicine

## 2025-01-24 ENCOUNTER — Ambulatory Visit
